# Patient Record
Sex: Female | Born: 1960 | Race: White | Hispanic: No | Marital: Married | Smoking: Current every day smoker
Health system: Southern US, Community
[De-identification: ages and names within clinical notes are randomized; demographics above are authoritative.]

## PROBLEM LIST (undated history)

## (undated) DIAGNOSIS — R112 Nausea with vomiting, unspecified: Secondary | ICD-10-CM

## (undated) DIAGNOSIS — Z9889 Other specified postprocedural states: Secondary | ICD-10-CM

## (undated) DIAGNOSIS — R630 Anorexia: Secondary | ICD-10-CM

## (undated) DIAGNOSIS — K859 Acute pancreatitis without necrosis or infection, unspecified: Secondary | ICD-10-CM

## (undated) DIAGNOSIS — F32A Depression, unspecified: Secondary | ICD-10-CM

## (undated) DIAGNOSIS — J45909 Unspecified asthma, uncomplicated: Secondary | ICD-10-CM

## (undated) DIAGNOSIS — F1010 Alcohol abuse, uncomplicated: Secondary | ICD-10-CM

## (undated) DIAGNOSIS — T4145XA Adverse effect of unspecified anesthetic, initial encounter: Secondary | ICD-10-CM

## (undated) DIAGNOSIS — F419 Anxiety disorder, unspecified: Secondary | ICD-10-CM

## (undated) DIAGNOSIS — T7840XA Allergy, unspecified, initial encounter: Secondary | ICD-10-CM

## (undated) DIAGNOSIS — F329 Major depressive disorder, single episode, unspecified: Secondary | ICD-10-CM

## (undated) DIAGNOSIS — M5430 Sciatica, unspecified side: Secondary | ICD-10-CM

## (undated) DIAGNOSIS — T8859XA Other complications of anesthesia, initial encounter: Secondary | ICD-10-CM

## (undated) DIAGNOSIS — M549 Dorsalgia, unspecified: Secondary | ICD-10-CM

## (undated) DIAGNOSIS — D649 Anemia, unspecified: Secondary | ICD-10-CM

## (undated) HISTORY — PX: BACK SURGERY: SHX140

## (undated) HISTORY — DX: Anxiety disorder, unspecified: F41.9

## (undated) HISTORY — DX: Allergy, unspecified, initial encounter: T78.40XA

## (undated) HISTORY — PX: OTHER SURGICAL HISTORY: SHX169

## (undated) HISTORY — DX: Anemia, unspecified: D64.9

## (undated) HISTORY — DX: Unspecified asthma, uncomplicated: J45.909

---

## 2002-11-14 ENCOUNTER — Other Ambulatory Visit: Admission: RE | Admit: 2002-11-14 | Discharge: 2002-11-14 | Payer: Self-pay | Admitting: Obstetrics and Gynecology

## 2004-01-03 ENCOUNTER — Other Ambulatory Visit: Admission: RE | Admit: 2004-01-03 | Discharge: 2004-01-03 | Payer: Self-pay | Admitting: Obstetrics and Gynecology

## 2005-02-11 ENCOUNTER — Other Ambulatory Visit: Admission: RE | Admit: 2005-02-11 | Discharge: 2005-02-11 | Payer: Self-pay | Admitting: Obstetrics and Gynecology

## 2007-12-25 ENCOUNTER — Emergency Department (HOSPITAL_COMMUNITY): Admission: EM | Admit: 2007-12-25 | Discharge: 2007-12-25 | Payer: Self-pay | Admitting: Family Medicine

## 2008-03-16 ENCOUNTER — Inpatient Hospital Stay (HOSPITAL_COMMUNITY): Admission: RE | Admit: 2008-03-16 | Discharge: 2008-03-21 | Payer: Self-pay | Admitting: Psychiatry

## 2008-03-16 ENCOUNTER — Ambulatory Visit: Payer: Self-pay | Admitting: Psychiatry
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
## 2011-03-07 NOTE — Discharge Summary (Signed)
NAMEYUETTE, PUTNAM        ACCOUNT NO.:  [id number]   MEDICAL RECORD NO.:  [id number]          PATIENT TYPE:  IPS   LOCATION:  0504                          FACILITY:  [facility]   PHYSICIAN:  Geoffery Lyons, M.D.      DATE OF BIRTH:  10-Jul-1961   DATE OF ADMISSION:  03/16/2008  DATE OF DISCHARGE:  03/21/2008                               DISCHARGE SUMMARY   CHIEF COMPLAINT AND PRESENT ILLNESS:  This was the first admission to  [name] for this 50 year old female.  She was brought  by her psychotherapist for inpatient treatment after she had attempted  suicide 2 weeks prior to this admission.  Apparently, she took a car and  went off on her own to a quiet place and took an overdose of the Ambien  and Zoloft and multiple other medications she could not remember hoping  to die in the car where no one would find her.  She woke up later sick,  took some more medications, and then fell miserable and managed to call  her husband who took her home where she suffered tremulousness with  nausea and vomiting.  She was not taken to the emergency room.  This was  2 weeks prior to this admission.  She has continued to feel very  depressed, feeling that she cannot go on living like this.  An  acquaintance of hers committed suicide 1 week prior to this admission.  She attended the funeral.  This increased her suicidal thoughts.  She  endorsed she has been dealing with depression for a long time since she  was a child.  First thoughts of suicide extend to attempts in high  school for which she received no treatment.  She had been drinking  alcohol since age 61.  Drinks 1 fifth of vodka daily along with several  glasses of wine.  She has a history of restricting her eating, trying to  limit her calories to 800 per day and routinely taking Extra Strength Ex-  Lax.  Over the course of the last year, she has been trying to cut back  on alcohol.  She had used some OxyContin, Percocet,  other opiates.  History of eating disorder.   PAST PSYCHIATRIC HISTORY:  She is being followed by Dr. Meredith Staggers and  Dr. Marliss Czar and has been seeing them for 15 months.  In the past,  she had been on Zoloft and Abilify.  She was putting weight, so she  transitioned to Wellbutrin.   ALCOHOL AND DRUG HISTORY:  As already stated, persistent use of alcohol.  She has one DWI charge.  Attended an intensive outpatient program at  [facility].   MEDICAL HISTORY:  Noncontributory.   MEDICATIONS:  Wellbutrin XL 300 mg per day.   PHYSICAL EXAMINATION:  Failed to show any acute findings.   LABORATORY WORK:  White blood cells 5.2, hemoglobin 14.9.  Sodium 139,  potassium 3.8, creatinine 0.8, glucose 83, SGOT 26, SGPT 15, total  bilirubin 0.1.   MENTAL STATUS EXAMINATION:  Reveals an alert cooperative female.  Mood  depressed.  Affect depressed, tearful at times.  Able to collect  herself, very appropriate, polite.  Speech is soft, barely audible at  times, normal in production.  Thought processes logical, coherent and  relevant.  Admitted to many years of heavy alcohol use.  There was  evidence of very poor self-esteem, suicidal thoughts, can contract for  safety.  No homicidal ideas, no delusions.  No hallucinations.  Cognition well-preserved.   ADMISSION DIAGNOSES:  AXIS I:  Alcohol dependence.  Major depression,  recurrent.  Eating disorder, not otherwise specified.  AXIS II:  No diagnosis.  AXIS III:  No diagnosis.  AXIS IV:  Moderate.  AXIS V:  On admission 35, highest GAF in the last year 75-80.   COURSE IN THE HOSPITAL:  She was admitted, started individual and group  psychotherapy.  We detoxified with Librium.  Resumed her Wellbutrin, and  she was also placed on Abilify.  She was able to open up, initially  quite resistant to address her issues.  There were some body image  distortions.  There was intellectualization rationalization.  June 1,  was having a hard time  coping. Endorsed she was under a lot of stress,  and it had to do with conflict in the relationship with the husband,  demands from work. Many of these demands were self-imposed.  Little  tolerance of being by herself.  Endorsed that she has used alcohol and  has been able to function with alcohol through the years.  No  significant sobriety.  We discussed options.  The need for going to  residential program was raised.  On June 2, she was fully detoxed.  She  was willing to go to the [facility] of [place], [place], to continue to  work on her recovery.   DISCHARGE DIAGNOSES:  AXIS I:  Alcohol dependence.  Major depressive  disorder.  Eating disorder, not otherwise specified.  AXIS II:  No diagnosis.  AXIS III:  No diagnosis.  AXIS IV:  Moderate.  AXIS V:  On discharge 50-55.   Discharged on:  1. Wellbutrin XL 300 mg in the morning.  2. Abilify 5 mg 1/2 daily in the morning.  3. Trazodone 50 mg at night.  4. Campral 333 mg two 3 times a day.   Follow up through [facility] [place], [place].      Geoffery Lyons, M.D.  Electronically Signed     IL/MEDQ  D:  04/18/2008  T:  04/18/2008  Job:  [id number]

## 2011-04-22 ENCOUNTER — Emergency Department (HOSPITAL_COMMUNITY)
Admission: EM | Admit: 2011-04-22 | Discharge: 2011-04-23 | Disposition: A | Discharge status: Other Acute Inpatient Hospital | Payer: BC Managed Care – PPO | Attending: Emergency Medicine | Admitting: Emergency Medicine

## 2011-04-22 DIAGNOSIS — F3289 Other specified depressive episodes: Secondary | ICD-10-CM | POA: Insufficient documentation

## 2011-04-22 DIAGNOSIS — R45851 Suicidal ideations: Secondary | ICD-10-CM | POA: Insufficient documentation

## 2011-04-22 DIAGNOSIS — F329 Major depressive disorder, single episode, unspecified: Secondary | ICD-10-CM | POA: Insufficient documentation

## 2011-04-22 LAB — CBC
MCH: 31.8 pg (ref 26.0–34.0)
MCHC: 33.1 g/dL (ref 30.0–36.0)
MCV: 96 fL (ref 78.0–100.0)
Platelets: 204 10*3/uL (ref 150–400)
RBC: 3.77 MIL/uL — ABNORMAL LOW (ref 3.87–5.11)

## 2011-04-22 LAB — DIFFERENTIAL
Basophils Relative: 0 % (ref 0–1)
Eosinophils Absolute: 0.1 10*3/uL (ref 0.0–0.7)
Eosinophils Relative: 1 % (ref 0–5)
Lymphs Abs: 0.8 10*3/uL (ref 0.7–4.0)
Monocytes Absolute: 0.5 10*3/uL (ref 0.1–1.0)
Monocytes Relative: 6 % (ref 3–12)
Neutrophils Relative %: 81 % — ABNORMAL HIGH (ref 43–77)

## 2011-04-22 LAB — COMPREHENSIVE METABOLIC PANEL
ALT: 13 U/L (ref 0–35)
AST: 17 U/L (ref 0–37)
Albumin: 3.8 g/dL (ref 3.5–5.2)
Alkaline Phosphatase: 55 U/L (ref 39–117)
CO2: 27 mEq/L (ref 19–32)
Chloride: 108 mEq/L (ref 96–112)
GFR calc non Af Amer: >60 mL/min (ref >60)
Potassium: 4.1 mEq/L (ref 3.5–5.1)
Sodium: 142 mEq/L (ref 135–145)
Total Bilirubin: 0.1 mg/dL — ABNORMAL LOW (ref 0.3–1.2)

## 2011-04-22 LAB — RAPID URINE DRUG SCREEN, HOSP PERFORMED
Barbiturates: NOT DETECTED
Tetrahydrocannabinol: NOT DETECTED

## 2011-04-23 ENCOUNTER — Inpatient Hospital Stay (HOSPITAL_COMMUNITY)
Admission: AD | Admit: 2011-04-23 | Discharge: 2011-04-25 | DRG: 430 | Disposition: A | Discharge status: Home / Self Care | Payer: BC Managed Care – PPO | Source: Ambulatory Visit | Attending: Psychiatry | Admitting: Psychiatry

## 2011-04-23 DIAGNOSIS — Z882 Allergy status to sulfonamides status: Secondary | ICD-10-CM

## 2011-04-23 DIAGNOSIS — R45851 Suicidal ideations: Secondary | ICD-10-CM

## 2011-04-23 DIAGNOSIS — F5000 Anorexia nervosa, unspecified: Secondary | ICD-10-CM

## 2011-04-23 DIAGNOSIS — F1020 Alcohol dependence, uncomplicated: Secondary | ICD-10-CM

## 2011-04-23 DIAGNOSIS — N809 Endometriosis, unspecified: Secondary | ICD-10-CM

## 2011-04-23 DIAGNOSIS — Z818 Family history of other mental and behavioral disorders: Secondary | ICD-10-CM

## 2011-04-23 DIAGNOSIS — F339 Major depressive disorder, recurrent, unspecified: Secondary | ICD-10-CM

## 2011-04-23 DIAGNOSIS — F17200 Nicotine dependence, unspecified, uncomplicated: Secondary | ICD-10-CM

## 2011-04-23 DIAGNOSIS — Z6379 Other stressful life events affecting family and household: Secondary | ICD-10-CM

## 2011-04-23 DIAGNOSIS — F332 Major depressive disorder, recurrent severe without psychotic features: Principal | ICD-10-CM
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
## 2011-04-28 NOTE — Discharge Summary (Signed)
[name], [name]        ACCOUNT NO.:  [id number]  MEDICAL RECORD NO.:  [id number]  LOCATION:  0506                          FACILITY:  [facility]  PHYSICIAN:  Franchot Gallo, MD     DATE OF BIRTH:  May 01, 1961  DATE OF ADMISSION:  04/23/2011 DATE OF DISCHARGE:  04/25/2011                              DISCHARGE SUMMARY   SUMMARY OF WHY ADMITTED:  The patient was admitted for evaluation of depressive symptoms with suicidal ideations as well as a history of anorexia nervosa with a recent weight loss down to 92 pounds.  The patient, at that time, stated that in the past several years her maximal weight has been approximately 108.  HOSPITAL COURSE:  As stated above, the patient was admitted for evaluation of depression and her eating disorder on April 23, 2011, and was seen by this physician on that date.  The patient was continued on Wellbutrin XL at 300 mg p.o. q.a.m. for depression, and the medication Prozac was started at 20 mg p.o. q.a.m. to further address her depressive symptoms and to possibly affect her appetite related to her eating disorder.  She was continued on the medication Abilify at 2 mg p.o. q.a.m. as well as her Topamax 50 mg p.o. b.i.d.,  started on a multivitamin and continued on trazodone 50 mg p.o. q.h.s. as needed for sleep.  The patient was next seen by this provider on April 24, 2011, and stated that she was sleeping good without difficulty.  She reported a good appetite and reported some mild feelings of sadness, anhedonia and depressed mood.  She rated her depression on a scale of 1 to 10 as a 3 and felt "more hopeful today."  She denied any suicidal or homicidal ideations or any auditory or visual hallucinations or delusional thinking.  She also denied any anxiety symptoms.  She denied any medication related side effects and stated that her weight today was up to 2-1/2 pounds to 94-1/2 pounds.  She was continued on her current medications.  The patient  was seen again by this provider on April 25, 2011, and stated that she was sleeping well without difficulty and reported a good appetite.  She stated that her depression was very mild, and on a scale of 1-10 was a 2.  She adamantly denied any suicidal or homicidal ideations.  She also denied any auditory or visual hallucinations or delusional thinking.  She stated her anxiety was nonexistent and on a scale of 1 to 10 was a zero, and denied any medication related side effects.  She stated that her weight today was 95 pounds.  The patient requested discharge and this was ordered per her request.  SIGNIFICANT LABORATORIES STUDIES:  None.  DISCHARGE MEDICATIONS: 1. Abilify 2 mg p.o. q.h.s. to address the patient's delusional     symptoms related to her body weight. 2. Wellbutrin XL 300 mg p.o. q.a.m. for depression. 3. Prozac 20 mg p.o. q.a.m. for depression and for treatment of her     eating disorder. 4. Multivitamin p.o. q.a.m. to improve her nutritional status. 5. Topamax 50 mg p.o. b.i.d. as a mood stabilizer and for anxiety. 6. Trazodone 50 mg p.o. q.h.s. p.r.n. for sleep.  DISCHARGE DIAGNOSES:  Axis I:  Major depressive disorder, recurrent, mild. Anorexia nervosa, currently under fair control. Alcohol dependence, recent relapse. Axis II:  Deferred. Axis III:  Laparoscopy in 1986 for treatment of endometriosis. Axis IV:  Recent marital separation and upcoming divorce.  Recent relapse in regards to her alcohol dependence.  Work related stress. Axis V:  GAF at time of admission approximately 35.  Highest GAF in the past year approximately 60.  GAF at time of discharge approximately 70.  CONDITION AT TIME OF DISCHARGE:  The patient was alert and oriented x3 and was friendly and cooperative with this provider.  Speech was appropriate in terms of rate and volume.  Mood appeared mildly depressed.  Affect was slightly constricted.  Thoughts:  The patient denied any obvious delusions  or hallucinations, nor does she report any suicidal or homicidal ideations.  Judgment and insight both appeared fair to good.  Anxiety was under good control and the patient denied any substance abuse related side effects or withdrawal symptoms.  FOLLOW-UP INSTRUCTIONS:  The patient is to follow up with her therapist, Marliss Czar, at [facility] tomorrow, Saturday, April 26, 2011 at 11:00 a.m.  She is also to follow-up with Dr. Jennelle Human at [facility] on May 20, 2011 at 11:00 a.m..  The patient also states that she is planning to attend an AA meeting tonight at discharge as well as meet with her sponsor.  She also plans to become involved in support groups for eating disorders.    __________________________________ Franchot Gallo, MD     RR/MEDQ  D:  04/25/2011  T:  04/26/2011  Job:  [id number]  Electronically Signed by Franchot Gallo MD on 04/28/2011 07:42:08 AM

## 2011-07-14 LAB — DIFFERENTIAL
Basophils Absolute: 0
Basophils Relative: 0
Eosinophils Relative: 0
Monocytes Absolute: 0.5
Neutro Abs: 10.9 — ABNORMAL HIGH

## 2011-07-14 LAB — CBC
Hemoglobin: 13.4
MCHC: 34.3
Platelets: 252
RDW: 12.9

## 2011-07-16 LAB — URINALYSIS, ROUTINE W REFLEX MICROSCOPIC
Bilirubin Urine: NEGATIVE
Ketones, ur: NEGATIVE
Nitrite: NEGATIVE
Protein, ur: NEGATIVE
Urobilinogen, UA: 0.2

## 2011-07-16 LAB — TSH: TSH: 5.183

## 2011-07-16 LAB — COMPREHENSIVE METABOLIC PANEL
ALT: 15
AST: 26
Calcium: 9.4
GFR calc Af Amer: >60
Glucose, Bld: 83
Sodium: 139
Total Protein: 7.5

## 2011-07-16 LAB — CBC
MCHC: 34
Platelets: 263
RDW: 13.4

## 2011-07-16 LAB — DRUGS OF ABUSE SCREEN W/O ALC, ROUTINE URINE
Amphetamine Screen, Ur: NEGATIVE
Cocaine Metabolites: NEGATIVE
Creatinine,U: 97.7
Phencyclidine (PCP): NEGATIVE
Propoxyphene: NEGATIVE

## 2011-07-16 LAB — PREGNANCY, URINE: Preg Test, Ur: NEGATIVE

## 2011-11-03 ENCOUNTER — Encounter (HOSPITAL_COMMUNITY): Payer: Self-pay | Admitting: *Deleted

## 2011-11-03 ENCOUNTER — Emergency Department (HOSPITAL_COMMUNITY)
Admission: EM | Admit: 2011-11-03 | Discharge: 2011-11-04 | Disposition: A | Discharge status: Other Acute Inpatient Hospital | Payer: BC Managed Care – PPO | Attending: Emergency Medicine | Admitting: Emergency Medicine

## 2011-11-03 DIAGNOSIS — F329 Major depressive disorder, single episode, unspecified: Secondary | ICD-10-CM | POA: Insufficient documentation

## 2011-11-03 DIAGNOSIS — F17200 Nicotine dependence, unspecified, uncomplicated: Secondary | ICD-10-CM | POA: Insufficient documentation

## 2011-11-03 DIAGNOSIS — F32A Depression, unspecified: Secondary | ICD-10-CM

## 2011-11-03 DIAGNOSIS — F1011 Alcohol abuse, in remission: Secondary | ICD-10-CM | POA: Insufficient documentation

## 2011-11-03 DIAGNOSIS — F3289 Other specified depressive episodes: Secondary | ICD-10-CM | POA: Insufficient documentation

## 2011-11-03 DIAGNOSIS — Z79899 Other long term (current) drug therapy: Secondary | ICD-10-CM | POA: Insufficient documentation

## 2011-11-03 DIAGNOSIS — F1010 Alcohol abuse, uncomplicated: Secondary | ICD-10-CM

## 2011-11-03 DIAGNOSIS — R45851 Suicidal ideations: Secondary | ICD-10-CM | POA: Insufficient documentation

## 2011-11-03 HISTORY — DX: Depression, unspecified: F32.A

## 2011-11-03 HISTORY — DX: Anorexia: R63.0

## 2011-11-03 HISTORY — DX: Alcohol abuse, uncomplicated: F10.10

## 2011-11-03 HISTORY — DX: Major depressive disorder, single episode, unspecified: F32.9

## 2011-11-03 LAB — CBC
HCT: 38.7 % (ref 36.0–46.0)
Hemoglobin: 12.8 g/dL (ref 12.0–15.0)
MCH: 32.2 pg (ref 26.0–34.0)
MCHC: 33.1 g/dL (ref 30.0–36.0)
MCV: 97.2 fL (ref 78.0–100.0)
Platelets: 216 10*3/uL (ref 150–400)
RBC: 3.98 MIL/uL (ref 3.87–5.11)
RDW: 13.7 % (ref 11.5–15.5)
WBC: 5.2 10*3/uL (ref 4.0–10.5)

## 2011-11-03 LAB — COMPREHENSIVE METABOLIC PANEL
ALT: 12 U/L (ref 0–35)
AST: 18 U/L (ref 0–37)
Albumin: 3.9 g/dL (ref 3.5–5.2)
Alkaline Phosphatase: 42 U/L (ref 39–117)
BUN: 14 mg/dL (ref 6–23)
CO2: 24 mEq/L (ref 19–32)
Calcium: 9.1 mg/dL (ref 8.4–10.5)
Chloride: 103 mEq/L (ref 96–112)
Creatinine, Ser: 0.92 mg/dL (ref 0.50–1.10)
GFR calc Af Amer: 83 mL/min — ABNORMAL LOW (ref >90)
GFR calc non Af Amer: 71 mL/min — ABNORMAL LOW (ref >90)
Glucose, Bld: 89 mg/dL (ref 70–99)
Potassium: 4 mEq/L (ref 3.5–5.1)
Sodium: 136 mEq/L (ref 135–145)
Total Bilirubin: 0.3 mg/dL (ref 0.3–1.2)
Total Protein: 7 g/dL (ref 6.0–8.3)

## 2011-11-03 LAB — DIFFERENTIAL
Basophils Absolute: 0 10*3/uL (ref 0.0–0.1)
Basophils Relative: 0 % (ref 0–1)
Eosinophils Absolute: 0.1 10*3/uL (ref 0.0–0.7)
Eosinophils Relative: 2 % (ref 0–5)
Lymphocytes Relative: 23 % (ref 12–46)
Lymphs Abs: 1.2 10*3/uL (ref 0.7–4.0)
Monocytes Absolute: 0.4 10*3/uL (ref 0.1–1.0)
Monocytes Relative: 7 % (ref 3–12)
Neutro Abs: 3.6 10*3/uL (ref 1.7–7.7)
Neutrophils Relative %: 68 % (ref 43–77)

## 2011-11-03 LAB — RAPID URINE DRUG SCREEN, HOSP PERFORMED
Amphetamines: NOT DETECTED
Barbiturates: NOT DETECTED
Benzodiazepines: POSITIVE — AB
Cocaine: NOT DETECTED
Opiates: NOT DETECTED
Tetrahydrocannabinol: NOT DETECTED

## 2011-11-03 LAB — ETHANOL: Alcohol, Ethyl (B): <11 mg/dL (ref 0–11)

## 2011-11-03 MED ORDER — ONDANSETRON HCL 4 MG PO TABS: 4.0000 mg | ORAL_TABLET | Freq: Three times a day (TID) | ORAL | Status: DC | PRN

## 2011-11-03 MED ORDER — TRAZODONE HCL 50 MG PO TABS: 50.0000 mg | ORAL_TABLET | Freq: Every day | ORAL | Status: DC

## 2011-11-03 MED ORDER — ARIPIPRAZOLE 5 MG PO TABS
5.0000 mg | ORAL_TABLET | Freq: Every day | ORAL | Status: DC
  Filled 2011-11-03: qty 1

## 2011-11-03 MED ORDER — ZOLPIDEM TARTRATE 5 MG PO TABS: 5.0000 mg | ORAL_TABLET | Freq: Every evening | ORAL | Status: DC | PRN

## 2011-11-03 MED ORDER — TRAZODONE HCL 100 MG PO TABS
100.0000 mg | ORAL_TABLET | Freq: Every day | ORAL | Status: DC
  Administered 2011-11-03: 100 mg via ORAL
  Filled 2011-11-03: qty 1

## 2011-11-03 MED ORDER — TOPIRAMATE 25 MG PO TABS: 25.0000 mg | ORAL_TABLET | Freq: Two times a day (BID) | ORAL | Status: DC

## 2011-11-03 MED ORDER — IBUPROFEN 200 MG PO TABS
600.0000 mg | ORAL_TABLET | Freq: Three times a day (TID) | ORAL | Status: DC | PRN
Start: 2011-11-03 — End: 2011-11-04

## 2011-11-03 MED ORDER — LORAZEPAM 1 MG PO TABS
1.0000 mg | ORAL_TABLET | Freq: Three times a day (TID) | ORAL | Status: DC | PRN
  Administered 2011-11-03: 1 mg via ORAL
  Filled 2011-11-03: qty 1

## 2011-11-03 MED ORDER — FLUOXETINE HCL 20 MG PO CAPS
20.0000 mg | ORAL_CAPSULE | Freq: Every day | ORAL | Status: DC
  Administered 2011-11-03: 20 mg via ORAL
  Filled 2011-11-03 (×2): qty 1

## 2011-11-03 MED ORDER — TRAZODONE HCL 100 MG PO TABS: 100.0000 mg | ORAL_TABLET | Freq: Every day | ORAL | Status: DC

## 2011-11-03 MED ORDER — TOPIRAMATE 25 MG PO TABS: 50.0000 mg | ORAL_TABLET | Freq: Two times a day (BID) | ORAL | Status: DC

## 2011-11-03 MED ORDER — ARIPIPRAZOLE 10 MG PO TABS
10.0000 mg | ORAL_TABLET | Freq: Every day | ORAL | Status: DC
  Filled 2011-11-03 (×2): qty 1

## 2011-11-03 MED ORDER — ACETAMINOPHEN 325 MG PO TABS: 650.0000 mg | ORAL_TABLET | ORAL | Status: DC | PRN

## 2011-11-03 MED ORDER — BUPROPION HCL ER (XL) 300 MG PO TB24
300.0000 mg | ORAL_TABLET | Freq: Every day | ORAL | Status: DC
  Filled 2011-11-03: qty 1

## 2011-11-03 MED ORDER — BUPROPION HCL ER (XL) 300 MG PO TB24
300.0000 mg | ORAL_TABLET | Freq: Every day | ORAL | Status: DC
  Filled 2011-11-03 (×2): qty 1

## 2011-11-03 MED ORDER — TOPIRAMATE 25 MG PO TABS
25.0000 mg | ORAL_TABLET | Freq: Every day | ORAL | Status: DC
  Administered 2011-11-03: 25 mg via ORAL
  Filled 2011-11-03: qty 1

## 2011-11-03 NOTE — ED Notes (Signed)
3 Pt belonging bags placed in Equipment room of [facility]

## 2011-11-03 NOTE — ED Notes (Signed)
Pt states "have been trying to do outpt, taking the librium since Wed, but it's not working, I want to kill myself, I have several plans, I have tried to do this twice before, the last time I just took 27 sleeping pills and so I accumulated, I think, about 40.  I also, I have access to the roof of my building and that's about 5 stories high, and I thought about just loading my car with a bunch of booze and talking all the pills I could get my hands on and leaving & going where no on could find me, I also thought about going out [place] a [place] and slamming my car head on into a tree"

## 2011-11-03 NOTE — BH Assessment (Signed)
Assessment Note   [name] [name] is an 51 y.o. female. Pt presented to the [facility] with a chief complaint of depression, SI with plan and alcohol abuse. Pt states that she has been really depressed for 2 weeks, before she had an "pretty bad episode", drinking large amounts of Vodka. Pt states that she has agreed with her therapist to complete an outpatient detox and was taking librium, though the depression worsened. Pt states that she has never felt this deep of a depression before. "I have been unable to get out of bed" and "I just think about ending it all." Pt states she had been sober since 06/30/11 though began drinking heavily after "things at work were going really poorly and crashing down." Pt states at this point she is unsure if she is still employed. Pt reports her last use was 10/29/11 and had  2 drinks equalling a pint of vodka. Pt has a hx of 2 previous SI attempts with the last attempt 2 years ago. Pt states that she had taken 27 [place] and [place] her home. Pt states she was not hospitalized at that time because her then husband "didn't want anyone to know". Pt's mood is depressed with a flat affect. Pt states her current SI plan is to "take all her pills or drive away where no one can find her, take her pills and then drink booze."  Pt information has been sent to [facility] and [facility] for review.   Axis I: Major Depression, Recurrent severe Axis II: Deferred Axis III:  Past Medical History  Diagnosis Date  . Anorexia   . Alcohol abuse   . Depression    Axis IV: other psychosocial or environmental problems and problems with primary support group Axis V: 31-40 impairment in reality testing  Past Medical History:  Past Medical History  Diagnosis Date  . Anorexia   . Alcohol abuse   . Depression     Past Surgical History  Procedure Date  . Endometrial laproscopic     Family History: No family history on  file.  Social History:  reports that she has been smoking.  She does not have any smokeless tobacco history on file. She reports that she drinks alcohol. She reports that she does not use illicit drugs.  Additional Social History:    Allergies:  Allergies  Allergen Reactions  . Sulfa Antibiotics Hives    Hives & edema    Home Medications:  Medications Prior to Admission  Medication Dose Route Frequency Provider Last Rate Last Dose  . acetaminophen (TYLENOL) tablet 650 mg  650 mg Oral Q4H PRN Raeford Razor, MD      . ARIPiprazole (ABILIFY) tablet 10 mg  10 mg Oral Daily Raeford Razor, MD      . buPROPion (WELLBUTRIN XL) 24 hr tablet 300 mg  300 mg Oral Daily Raeford Razor, MD      . FLUoxetine (PROZAC) capsule 20 mg  20 mg Oral Daily Raeford Razor, MD      . ibuprofen (ADVIL,MOTRIN) tablet 600 mg  600 mg Oral Q8H PRN Raeford Razor, MD      . LORazepam (ATIVAN) tablet 1 mg  1 mg Oral Q8H PRN Raeford Razor, MD      . ondansetron [facility]) tablet 4 mg  4 mg Oral Q8H PRN Raeford Razor, MD      . topiramate (TOPAMAX) tablet 50 mg  50 mg Oral BID Jeannett Senior  Kohut, MD      . traZODone (DESYREL) tablet 50 mg  50 mg Oral QHS Raeford Razor, MD      . zolpidem [facility]) tablet 5 mg  5 mg Oral QHS PRN Raeford Razor, MD       No current outpatient prescriptions on file as of 11/03/2011.    OB/GYN Status:  Patient's last menstrual period was 10/20/2011.  General Assessment Data Location of Assessment: [facility] ED Living Arrangements: Children (49 y/o daughter) Can pt return to current living arrangement?: Yes Admission Status: Voluntary Is patient capable of signing voluntary admission?: Yes Transfer from: Acute Hospital Referral Source: Self/Family/Friend  Education Status Is patient currently in school?: No  Risk to self Suicidal Ideation: Yes-Currently Present Suicidal Intent: Yes-Currently Present Is patient at risk for suicide?: Yes Suicidal Plan?: Yes-Currently Present Specify Current  Suicidal Plan: "I counted out all my pills to take them" "I also thought about taking all my pills and booze and driving away where no one can find me" Access to Means: Yes Specify Access to Suicidal Means: has medications and car What has been your use of drugs/alcohol within the last 12 months?: ETOH; 1/5 and 1/2 vodka daily past few weeks Previous Attempts/Gestures: Yes How many times?: 2  Other Self Harm Risks: none reported Triggers for Past Attempts: Other (Comment) (Depression) Intentional Self Injurious Behavior: None Family Suicide History: Yes (Maternal grandmother- alcohol, suicide) Recent stressful life event(s): Other (Comment) (issues at work) Persecutory voices/beliefs?: No Depression: Yes Depression Symptoms: Despondent;Insomnia;Tearfulness;Isolating;Fatigue;Loss of interest in usual pleasures;Feeling worthless/self pity Substance abuse history and/or treatment for substance abuse?: Yes Suicide prevention information given to non-admitted patients: Not applicable  Risk to Others Homicidal Ideation: No Thoughts of Harm to Others: No Current Homicidal Intent: No Current Homicidal Plan: No Access to Homicidal Means: No Identified Victim: none History of harm to others?: No Assessment of Violence: None Noted Violent Behavior Description: pt was calm and cooperative throughout assessment Does patient have access to weapons?: No Criminal Charges Pending?: No Does patient have a court date: No  Psychosis Hallucinations: None noted Delusions: None noted  Mental Status Report Appear/Hygiene: Disheveled Eye Contact: Fair Motor Activity: Unremarkable Speech: Soft;Slow;Logical/coherent Level of Consciousness: Quiet/awake Mood: Depressed;Sad;Empty Affect: Blunted;Depressed;Sad Anxiety Level: None Thought Processes: Coherent;Relevant Judgement: Impaired Orientation: Person;Place;Time;Situation Obsessive Compulsive Thoughts/Behaviors: None  Cognitive  Functioning Concentration: Decreased Memory: Recent Intact;Remote Intact IQ: Average Insight: Poor Impulse Control: Poor Appetite: Poor Weight Loss: 0  Weight Gain: 0  Sleep: No Change Total Hours of Sleep: 8  Vegetative Symptoms: Staying in bed  Prior Inpatient Therapy Prior Inpatient Therapy: Yes Prior Therapy Dates: 2008 Prior Therapy Facilty/Provider(s): [facility], [facility] Reason for Treatment: rehab  Prior Outpatient Therapy Prior Outpatient Therapy: Yes Prior Therapy Dates: 2008-present Prior Therapy Facilty/Provider(s): Dr. Jennelle Human, Dr. Farrel Demark Reason for Treatment: depression            Values / Beliefs Cultural Requests During Hospitalization: None Spiritual Requests During Hospitalization: None        Additional Information 1:1 In Past 12 Months?: No CIRT Risk: No Elopement Risk: No Does patient have medical clearance?: Yes     Disposition:  Disposition Disposition of Patient: Referred to [facility], OV) Patient referred to: Other (Comment) ([facility], [facility])  On Site Evaluation by:   Reviewed with Physician:     [place] Crane F 11/03/2011 10:03 PM

## 2011-11-04 ENCOUNTER — Inpatient Hospital Stay (HOSPITAL_COMMUNITY)
Admission: EM | Admit: 2011-11-04 | Discharge: 2011-11-10 | DRG: 750 | Disposition: A | Discharge status: Home / Self Care | Payer: BC Managed Care – PPO | Source: Ambulatory Visit | Attending: Psychiatry | Admitting: Psychiatry

## 2011-11-04 ENCOUNTER — Encounter (HOSPITAL_COMMUNITY): Payer: Self-pay | Admitting: *Deleted

## 2011-11-04 DIAGNOSIS — F1020 Alcohol dependence, uncomplicated: Principal | ICD-10-CM | POA: Diagnosis present

## 2011-11-04 DIAGNOSIS — F329 Major depressive disorder, single episode, unspecified: Secondary | ICD-10-CM

## 2011-11-04 DIAGNOSIS — Z79899 Other long term (current) drug therapy: Secondary | ICD-10-CM

## 2011-11-04 DIAGNOSIS — F332 Major depressive disorder, recurrent severe without psychotic features: Secondary | ICD-10-CM

## 2011-11-04 DIAGNOSIS — IMO0002 Reserved for concepts with insufficient information to code with codable children: Secondary | ICD-10-CM

## 2011-11-04 DIAGNOSIS — Z681 Body mass index (BMI) 19 or less, adult: Secondary | ICD-10-CM

## 2011-11-04 DIAGNOSIS — F39 Unspecified mood [affective] disorder: Secondary | ICD-10-CM

## 2011-11-04 DIAGNOSIS — F5000 Anorexia nervosa, unspecified: Secondary | ICD-10-CM

## 2011-11-04 DIAGNOSIS — R45851 Suicidal ideations: Secondary | ICD-10-CM

## 2011-11-04 DIAGNOSIS — Z882 Allergy status to sulfonamides status: Secondary | ICD-10-CM

## 2011-11-04 MED ORDER — TRAZODONE HCL 50 MG PO TABS
50.0000 mg | ORAL_TABLET | Freq: Every day | ORAL | Status: DC
  Administered 2011-11-04 – 2011-11-09 (×6): 50 mg via ORAL
  Filled 2011-11-04 (×8): qty 1

## 2011-11-04 MED ORDER — CHLORDIAZEPOXIDE HCL 25 MG PO CAPS
25.0000 mg | ORAL_CAPSULE | Freq: Three times a day (TID) | ORAL | Status: DC | PRN
  Administered 2011-11-04 – 2011-11-09 (×3): 25 mg via ORAL
  Filled 2011-11-04 (×3): qty 1

## 2011-11-04 MED ORDER — ALUM & MAG HYDROXIDE-SIMETH 200-200-20 MG/5ML PO SUSP: 30.0000 mL | ORAL | Status: DC | PRN

## 2011-11-04 MED ORDER — MAGNESIUM HYDROXIDE 400 MG/5ML PO SUSP
30.0000 mL | Freq: Every day | ORAL | Status: DC | PRN
  Administered 2011-11-07 – 2011-11-09 (×2): 30 mL via ORAL

## 2011-11-04 MED ORDER — ACETAMINOPHEN 325 MG PO TABS
650.0000 mg | ORAL_TABLET | Freq: Four times a day (QID) | ORAL | Status: DC | PRN
  Administered 2011-11-05 – 2011-11-10 (×10): 650 mg via ORAL

## 2011-11-04 MED ORDER — FLUOXETINE HCL 20 MG PO CAPS
20.0000 mg | ORAL_CAPSULE | Freq: Every day | ORAL | Status: DC
  Administered 2011-11-04 – 2011-11-06 (×3): 20 mg via ORAL
  Filled 2011-11-04 (×5): qty 1

## 2011-11-04 MED ORDER — ARIPIPRAZOLE 10 MG PO TABS
10.0000 mg | ORAL_TABLET | Freq: Every day | ORAL | Status: DC
  Administered 2011-11-04 – 2011-11-10 (×7): 10 mg via ORAL
  Filled 2011-11-04 (×9): qty 1

## 2011-11-04 MED ORDER — INFLUENZA VIRUS VACC SPLIT PF IM SUSP
0.5000 mL | INTRAMUSCULAR | Status: AC
  Administered 2011-11-05: 0.5 mL via INTRAMUSCULAR

## 2011-11-04 MED ORDER — TRAZODONE HCL 100 MG PO TABS: 100.0000 mg | ORAL_TABLET | Freq: Every evening | ORAL | Status: DC | PRN

## 2011-11-04 MED ORDER — BUPROPION HCL ER (XL) 300 MG PO TB24
300.0000 mg | ORAL_TABLET | Freq: Every day | ORAL | Status: DC
  Administered 2011-11-04 – 2011-11-10 (×7): 300 mg via ORAL
  Filled 2011-11-04 (×10): qty 1

## 2011-11-04 NOTE — Discharge Planning (Addendum)
Encounter with patient in hall just now.  Asking for meds.  Gave me a list of scheduled meds that include librium.  Asked when her last drink was and she said last Wed.  UDS positive for benzos.  Review of chart indicates she was being prescribed benzos on an out-pt basis.  Will call her provider for further info.   Kalysta signed release of info for [employer], her provider for mental health services since 08.  Sees Dr Jennelle Human and Marliss Czar.  Was on an outpt librium detox protocol prior to coming in.  Started on 1/10 for 2 days at the rate of 25 QID, 1/12 and 13 at TID,  Yesterday and today at BID and 1/16 and 17.  Detox protocol was in response to relapse in which Tzivia was drinking a 5th a gallon of vodka daily.  Also has been prescribed Camprol.  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  Therapist describes her as compulsively taking care of others.  Also anorexic.

## 2011-11-04 NOTE — Progress Notes (Signed)
Patient ID: [name] [name], female   DOB: 1961-09-30, 51 y.o.   MRN: [id number]  Admitted after SI with plan to either OD on sleeping pills or pack up car and load up on ETOH and drive to remote location to kill self. Patient reports having two previous suicide attempts. Last attempt is by OD of 27 sleeping pills. Patient then stated that her current ideation will have her ODing on more sleeping pills than before, as she knows how much her body could handle. States 'I feel like I don't want to live anymore.' Patient states that she is able to contract while on the unit but had a long pause before answering. Reports substance abuse with alcohol. Last drink was Wednesday 10/29/11, had nearly 1 pint. Started librium on 10/29/11. Has been drinking since age 31 but got worse over past 6 years r/t marriage falling apart (patient is now divorced) and other stressors that patient would not elaborate on. Currently lives with daughter, age 38. Also has son, 107, who is in college. States that children are the reason she did not go through with suicide this past week. Patient's BP on admission was 86/54 sitting and 84/60 standing. Patient reports that this is her baseline BP and this was the trend as patient was in the ED. Patient given fluids. Patient instructed about rising slowly from laying/seated positions to standing. Patient made fall risk for low BP and history of falls within past 6 months.  Skin assessment: blood draw to right [facility]  Emergency Contact: Trey Bebee (ex.husband) [phone]

## 2011-11-04 NOTE — ED Notes (Signed)
Patient is resting comfortably. 

## 2011-11-04 NOTE — ED Notes (Signed)
Report given. Tech and security with pt to transport to behavioral health. Pt is ambulatory with minimal assistance.

## 2011-11-04 NOTE — Tx Team (Signed)
Initial Interdisciplinary Treatment Plan  PATIENT STRENGTHS: (choose at least two) Average or above average intelligence Communication skills Financial means Physical Health Supportive family/friends  PATIENT STRESSORS: Marital or family conflict Substance abuse   PROBLEM LIST: Problem List/Patient Goals Date to be addressed Date deferred Reason deferred Estimated date of resolution  Risk for suicide 11/04/11     depression 11/04/11     Substance abuse- ETOH 11/04/11                                          DISCHARGE CRITERIA:  Ability to meet basic life and health needs Adequate post-discharge living arrangements Improved stabilization in mood, thinking, and/or behavior Motivation to continue treatment in a less acute level of care Withdrawal symptoms are absent or subacute and managed without 24-hour nursing intervention  PRELIMINARY DISCHARGE PLAN: Attend aftercare/continuing care group Attend PHP/IOP Attend 12-step recovery group Participate in family therapy Return to previous living arrangement  PATIENT/FAMIILY INVOLVEMENT: This treatment plan has been presented to and reviewed with the patient, [name] [name].  The patient and family have been given the opportunity to ask questions and make suggestions.  [name] [name] 11/04/2011, 5:46 AM

## 2011-11-04 NOTE — Progress Notes (Signed)
1-1 Note @ 1800 d/t suicidal ideation. Pt. Is currently on 1-1 resting in bed with eyes closed. 1-1 continues as pt. Is unable to contract for safety.Pt. Safety maintained.

## 2011-11-04 NOTE — Progress Notes (Signed)
Patient ID: [name] [name], female   DOB: 04/26/1961, 51 y.o.   MRN: [id number] Pt reports fair sleep and poor appetite.  Her energy level is low and her ability to pay attention is poor.  Pt has sciatica and wrote on self inventory that she thinks her pain is punishment for her  Wrongs.  She is attending group and looking forward to restarting meds.

## 2011-11-04 NOTE — Progress Notes (Signed)
2200 note for 1-1. Pt. Has a very flat ,blunted affect. Pt. Is not able to contract for safety. Staff with pt. @ all times. 1-1 Continues for increased pt safety. Pt. Safety maintained.

## 2011-11-04 NOTE — Progress Notes (Signed)
[facility] Group Notes:  (Counselor/Nursing/MHT/Case Management/Adjunct)  11/04/2011 4:04 PM   Type of Therapy:  Processing Group at 11:00 am  Participation Level:  Minimal  Participation Quality:  Attentive and quiet  Affect:  Depressed  Cognitive:  Oriented  Insight:  None shared  Engagement in Group:  Limited  Engagement in Therapy:  Limited  Modes of Intervention:  Exploration and clarification and support  Summary of Progress/Problems:  [name] [name] came in at that point the group processing session and was invited to share what brought her into the hospital with other members of the group.  Patient was slow to respond but did share that alcohol played a role in her admission. Patient was attentive throughout group.  [facility] Group Notes:  (Counselor/Nursing/MHT/Case Management/Adjunct)  11/04/2011 4:04 PM   Type of Therapy:  Counseling Group at 1:15 pm  Participation Level:  Limited  Participation Quality:  Attentive  Affect:  Depressed  Cognitive:  Oriented  Insight:  Limited  Engagement in Group:  Minimal  Engagement in Therapy:  Minimal  Modes of Intervention:  Exploration, education and support  Summary of Progress/Problems:  [name] [name] again came into group at midpoint' was attempted to educational components of group especially regarding PAWS and segment on anger.  [name] [name] was attentive to other group members yet hesitant to share. Patient did share that frustration is one of the hardest things for her to deal with; "Rarely do people see me angry" [name] [name], LCSWA 11/04/2011 4:04 PM

## 2011-11-04 NOTE — Progress Notes (Signed)
Adult Psychosocial Assessment Update Interdisciplinary Team  Previous [facility] admissions/discharges:  Admissions Discharges  Date: April 23, 2011 Date:  Date: Date:  Date: Date:  Date: Date:  Date: Date:   Changes since the last Psychosocial Assessment (including adherence to outpatient mental health and/or substance abuse treatment, situational issues contributing to decompensation and/or relapse). Pt was experiencing increased depression while completing outpatient detox with Librium; she and therapist decided she should come in for inpatient detox. Patient reports being sober from 06/30/2011 until New Year's E. when she relapsed on alcohol due to stressors about uncertainty of employment not knowing if her job is secure .patient reports using one to 2/5 of alcohol daily for 10 days. 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 Divorce will be finalized in the next few weeks patient shares custody of 54 year old daughter with her husband from whom she is separated, also has a son in college. Patient self-reports suicide attempt in 2010 previous diagnosis of anorexia alcohol abuse and depression.              Discharge Plan 1. Will you be returning to the same living situation after discharge?   Yes:X No:      If no, what is your plan?           2. Would you like a referral for services when you are discharged? Yes:     If yes, for what services?  No:   X    Patient sees a therapist by the name od Billey Gosling        Summary and Recommendations (to be completed by the evaluator) Patient is a 51 year old separated female admitted with diagnosis of major depression, recurrent. Patient's divorce is to be finalized this month; relapsed on alcohol (1-2 as per day) for 10 days reportedly 2 to job stress source as she is uncertain of her employment. Patient reports  anxiety re weight, currently at 109.5 as she considers 100 Pounds ideal. Patient will benefit from crisis stabilization, medication Evaluation, Group therapy and psychoeducation in addition to Case Management for discharge planning.                        Signature:  Clide Dales, 11/04/2011 3:54 PM

## 2011-11-04 NOTE — ED Provider Notes (Signed)
History    50yf with SI. Chronic etoh abuse and just stopped this past Wednesday. tx'd as outpt with librium. Since stopped has been increasingly depressed. Thoughts of OD or running car into tree. Says has been stock piling sleeping meds. Previous suicide attempt by od. Denies illicit drug use. No hi. No hallucinations. Previous psych admission for similar.  CSN: [id number]  Arrival date & time 11/03/11  1827   First MD Initiated Contact with Patient 11/03/11 2001      Chief Complaint  Patient presents with  . V70.1    (Consider location/radiation/quality/duration/timing/severity/associated sxs/prior treatment) HPI  Past Medical History  Diagnosis Date  . Anorexia   . Alcohol abuse   . Depression     Past Surgical History  Procedure Date  . Endometrial laproscopic     No family history on file.  History  Substance Use Topics  . Smoking status: Current Everyday Smoker -- 0.5 packs/day  . Smokeless tobacco: Not on file  . Alcohol Use: Yes     1.5 fifth QD    OB History    Grav Para Term Preterm Abortions TAB SAB Ect Mult Living                  Review of Systems   Review of symptoms negative unless otherwise noted in HPI.   Allergies  Sulfa antibiotics  Home Medications   Current Outpatient Rx  Name Route Sig Dispense Refill  . ARIPIPRAZOLE 5 MG PO TABS Oral Take 5 mg by mouth daily.    . BUPROPION HCL ER (XL) 150 MG PO TB24 Oral Take 300 mg by mouth every morning.    [name] CHLORDIAZEPOXIDE HCL 25 MG PO CAPS Oral Take 25 mg by mouth 4 (four) times daily as needed. sedated    . FLUOXETINE HCL 20 MG PO CAPS Oral Take 20 mg by mouth daily.    . TOPIRAMATE 25 MG PO TABS Oral Take 25 mg by mouth 2 (two) times daily.    . TRAZODONE HCL 100 MG PO TABS Oral Take 100 mg by mouth at bedtime.      BP 93/51  Pulse 57  Temp(Src) 98.6 F (37 C) (Oral)  Resp 18  Ht 5\' 6"  (1.676 m)  Wt 115 lb (52.164 kg)  BMI 18.56 kg/m2  SpO2 100%  LMP 10/20/2011  Physical  Exam  Nursing note and vitals reviewed. Constitutional: She appears well-developed. No distress.       Thin and frail appearing  HENT:  Head: Normocephalic and atraumatic.  Eyes: Conjunctivae are normal. Pupils are equal, round, and reactive to light. Right eye exhibits no discharge. Left eye exhibits no discharge.  Neck: Normal range of motion. Neck supple.  Cardiovascular: Normal rate, regular rhythm and normal heart sounds.  Exam reveals no gallop and no friction rub.   No murmur heard. Pulmonary/Chest: Effort normal and breath sounds normal. No respiratory distress.  Abdominal: Soft. She exhibits no distension. There is no tenderness.  Musculoskeletal: She exhibits no edema and no tenderness.  Lymphadenopathy:    She has no cervical adenopathy.  Neurological: She is alert.  Skin: Skin is warm and dry.  Psychiatric: Thought content normal.       flat affect. Speech clear and content appropriate.     ED Course  Procedures (including critical care time)  Labs Reviewed  URINE RAPID DRUG SCREEN (HOSP PERFORMED) - Abnormal; Notable for the following:    Benzodiazepines POSITIVE (*)    All other components  within normal limits  COMPREHENSIVE METABOLIC PANEL - Abnormal; Notable for the following:    GFR calc non Af Amer 71 (*)    GFR calc Af Amer 83 (*)    All other components within normal limits  CBC  DIFFERENTIAL  ETHANOL  POCT PREGNANCY, URINE  POCT PREGNANCY, URINE   No results found.   1. Depression   2. Suicidal ideation   3. ETOH abuse       MDM  50yF with etoh abuse, SI and depression. Plans to OD. Discussed with ACT who will eval. Pt in agreement for voluntary admission at this time.        Raeford Razor, MD 11/04/11 [phone]
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
## 2011-11-05 NOTE — Progress Notes (Signed)
Patient ID: [name] [name], female   DOB: [birth year], 51 y.o.   MRN: [id number] Nursing     Pt.rates her Depression and Hopelessness as 10/10,She has been medicated for sciatic pain with some relief.states she is having suicidal thoughts off and on, Remains on 1-1 for safety.Will follow.

## 2011-11-05 NOTE — Treatment Plan (Signed)
Interdisciplinary Treatment Plan Update (Adult)  Date: 11/05/2011  Time Reviewed: 9:37 AM   Progress in Treatment: Attending groups: Yes Participating in groups: Yes Taking medication as prescribed: Yes Tolerating medication: Yes   Family/Significant other contact made:  Bobby gave permission to talk to her therapist Patient understands diagnosis:  Yes  As evidenced by asking for help with alcoholism, depression Discussing patient identified problems/goals with staff:  Yes See below Medical problems stabilized or resolved:  Yes Denies suicidal/homicidal ideation: No  Per self inventory and in treatment team Issues/concerns per patient self-inventory:  Yes  Depression and hopelessness a 10   Sciatica pain is rated an 8 Other:  New problem(s) identified: N/A  Reason for Continuation of Hospitalization: Medication stabilization Withdrawal symptoms  Interventions implemented related to continuation of hospitalization: Librium taper  Encourage group attendance and participation  Address pain  Additional comments:On 1:1 today due to thoughts of self harm and extreme hopelessness  Estimated length of stay: 3-4 days  Discharge Plan: Return home  Follow up outpt  New goal(s): N/A  Review of initial/current patient goals per problem list:   1.  Goal(s):Safely detox from alcohol  Met:  No  Target date:1/18  As evidenced [id number] in CIWA score from current to 0  2.  Goal (s): Identify comprehensive sobriety plan  Met:  No  Target date:1/18  As evidenced UJ:WJXB disclosure  3.  Goal(s):Decrease depression  Met:  No  Target date:1/18  As evidenced JY:[id number] to 3 or less on self inventory  4.  Goal(s):  Met:  No  Target date:  As evidenced by:  Attendees: Patient:  [name] [name] 11/05/2011 9:37 AM  Family:     Physician:  Lupe Carney 11/05/2011 9:37 AM   Nursing:  Milinda Cave  11/05/2011 9:37 AM   Case Manager:  Richelle Ito, LCSW  11/05/2011 9:37 AM   Counselor:  Ronda Fairly, LCSWA 11/05/2011 9:37 AM   Other:     Other:     Other:     Other:      Scribe for Treatment Team:   Ida Rogue, 11/05/2011 9:37 AM

## 2011-11-05 NOTE — Progress Notes (Signed)
Pt attended morning d/c planning group and treatment team meeting. Stated that her psychiatrist felt that it was a good idea for her to check in at the hospital because she was concerned that Pt would harm herself. When asked if she still had feelings of harming herself, Pt response was "it's a good thing that I am here." Stated in treatment team that she has working 60-70 hours for a [employer] for several years without receiving any financial reimbursement. Feels guilty about the thought of not volunteering there anymore because she feels that it is what she is called to do. Also stated that the finalization of her divorce will be a relief for her. Complains of dizziness and pain due to a sciatic nerve in her back. Presents with depressed mood, and flat affect. Admits to alcohol dependence issues.  "I am caught up in this vicious cycle with alcohol.  I had 18 months clean before I relapsed."

## 2011-11-05 NOTE — Progress Notes (Signed)
[facility] Group Notes:  (Counselor/Nursing/MHT/Case Management/Adjunct)  11/05/2011 1:08 PM   Type of Therapy:  Processing Group at 11:00 am  Participation Level:  Minimal  Participation Quality:  Drowsy  Affect:  Depressed  Cognitive:  Oriented  Insight:  Limited  Engagement in Group:  Limited  Engagement in Therapy:  Limited  Modes of Intervention:  Exploration and clarification  Summary of Progress/Problems:  Favor's shared when asked about mindfulness that she spends most of her time in her thinking brain versus emotional brain. 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 Patient was very drowsy, cold and wrapped in blanket, and although hesitant to share seemed more open than first day.[name]  [facility] Group Notes:  (Counselor/Nursing/MHT/Case Management/Adjunct)  11/05/2011 3:57 PM   Type of Therapy:  Counseling Group at 1:15 pm  Participation Level:  Did Not Attend   Ronda Fairly, LCSWA 11/05/2011 3:57 PM

## 2011-11-05 NOTE — Progress Notes (Signed)
[facility] MD Progress Note  11/05/2011 10:53 AM  S/O: Pt seen and evaluated in treatment team.  Reviewed short term and long term goals, medications, current treatment in the hospital and acute/chronic safety.  Pt with sig depressive s/s, and still in need of 1:1.  Nevertheless, acknowledged kids as main protective factor.  Pt agreeable with treatment plan, see TP note and orders. Continue current medications.   Sleep:  Number of Hours: 6.5   Vital Signs:Blood pressure 81/49, pulse 76, temperature 97.9 F (36.6 C), temperature source Oral, resp. rate 16, height 5' 4.5" (1.638 m), weight 48.535 kg (107 lb), last menstrual period 10/20/2011.  Lab Results:  Results for orders placed during the hospital encounter of 11/04/11 (from the past 48 hour(s))  TSH     Status: Normal   Collection Time   11/04/11  7:55 PM      Component Value Range Comment   TSH 2.868  0.350 - 4.500 (uIU/mL)   T4, FREE     Status: Normal   Collection Time   11/04/11  7:55 PM      Component Value Range Comment   Free T4 0.89  0.80 - 1.80 (ng/dL)     Meds:   . ARIPiprazole  10 mg Oral Daily  . buPROPion  300 mg Oral Daily  . FLUoxetine  20 mg Oral Daily  . influenza  inactive virus vaccine  0.5 mL Intramuscular Tomorrow-1000  . traZODone  50 mg Oral QHS    A/P: Alcohol Dependence, possible continued w/d s/s; Mood Disorder NOS; Anorexia Nervosa; r/o BPD & PTSD  Significant depressive s/s noted.  Hx reported "mood swings".  Will continue current meds.  Medication education completed.  Pros, cons, risks, potential side effects and benefits were discussed with pt.  Pt agreeable with the plan.  See orders.  Discussed with team.   Will also continue 1:1 and reassess in am.  Lupe Carney 11/05/2011, 10:53 AM

## 2011-11-06 DIAGNOSIS — F339 Major depressive disorder, recurrent, unspecified: Secondary | ICD-10-CM

## 2011-11-06 MED ORDER — FLUOXETINE HCL 20 MG PO CAPS
40.0000 mg | ORAL_CAPSULE | Freq: Every day | ORAL | Status: DC
  Administered 2011-11-07 – 2011-11-10 (×4): 40 mg via ORAL
  Filled 2011-11-06 (×5): qty 2

## 2011-11-06 NOTE — Progress Notes (Signed)
Patient ID: [name] [name], female   DOB: [birth year], 51 y.o.   MRN: [id number]  Stated she would have had a better day if it had not been for pain. Writer attempted to converse with the pt, however she said her "head was too messed up". Pt denied SI during the assessment, but stated she had it earlier in the day. Support and encouragement was offered.

## 2011-11-06 NOTE — Progress Notes (Signed)
Patient ID: [name] [name], female   DOB: 1961/09/03, 51 y.o.   MRN: [id number] [name] [name]  51 y.o.  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 Reports she is still depressed and rates it as a 8/10. She states she no longer feels suicidal but states that "this is the worst it's ever been." She acknowledges that drinking alcohol didn't help the situation at all. She voices concern that her Wellbutrin just isn't working since she has been on it for the last 2-3 years and has gotten to this point in her life.  Reports her withdrawal symptoms are easing off, with the exception of feeling dizzy and light headed occasionally.  Objective:Vital Signs:Blood pressure 68/38, pulse 73, temperature 97.2 F (36.2 C), temperature source Oral, resp. rate 14, height 5' 4.5" (1.638 m), weight 107 lb (48.535 kg), last menstrual period 10/20/2011. Pt. Appears profoundly depressed. Voice is soft but more spontaneous than upon admission. Eye contact is only slightly improved. Mood is depressed and affect is congruent. Denies SI/HI and shows no symptoms of psychosis. She also appears uncomfortable due to sciatic pain. She is still on 1:1 and reports that she would speak to the staff if she feels that she is unsafe.  Assessment: Major DD severe                         Alcohol dependence Medications Scheduled:     . ARIPiprazole  10 mg Oral Daily  . buPROPion  300 mg Oral Daily  . FLUoxetine  20 mg Oral Daily  . traZODone  50 mg Oral QHS     PRN Meds acetaminophen, alum & mag hydroxide-simeth, chlordiazePOXIDE, magnesium hydroxide  Plan: Continue 1:1 at this time. Increase fluoxetine to 40mg  and continue the Welbutrin as is.  Situation is discussed with Dr. Javier Glazier who agrees with the above plan. Continue current plan of care with  no changes at this time.  Anticipated D/C is 3-5 days. [name]. [name] [facility] 11/06/2011 2:40 PM

## 2011-11-06 NOTE — Progress Notes (Signed)
1:1 RN documentation.  Patient sitting in the dayroom, sitting up in chair reading a book.  Patient is dressed and groomed.  She easily engages in conversation with staff.  Pleasant and smiling some.  Requested Tylenol for complaints of sciatic pain, which was given.  Remains on 1:1 for safety.  Patient remains safe at this time.

## 2011-11-06 NOTE — Progress Notes (Signed)
[facility] Adult Inpatient Family/Significant Other Suicide Prevention Education  Suicide Prevention Education:  Education Completed; [name] [name], a close friend at [phone], has been identified by the patient as the friend with whom the patient will be most honest with and identified as the person(s) who will aid the patient in the event of a mental health crisis (suicidal ideations/suicide attempt).  With written consent from the patient, [name] [name] has been provided the following suicide prevention education, prior to the and/or following the discharge of the patient.  The suicide prevention education provided includes the following:  Suicide risk factors  Suicide prevention and interventions  National Suicide Hotline telephone number  [facility] assessment telephone number  [facility] Emergency Assistance 911  [facility] and/or Residential Mobile Crisis Unit telephone number  Request made of family/significant other to:  Remove weapons (e.g., guns, rifles, knives), all items previously/currently identified as safety concern.    Remove drugs/medications (over-the-counter, prescriptions, illicit drugs), all items previously/currently identified as a safety concern.  [name] [name] states there are no firearms in the home and she is willing to be with the patient upon discharge for support should she wish to dispose of some of her current medications as [name] [name] states she has a large quantity of medications in the home.   The friend verbalizes understanding of the suicide prevention education information provided.    [name] [name] 11/06/2011, 4:56 PM

## 2011-11-06 NOTE — Progress Notes (Signed)
[facility] Group Notes:  (Counselor/Nursing/MHT/Case Management/Adjunct)  11/06/2011 3:13 PM   Type of Therapy:  Processing Group at 11:00 am  Participation Level:  Did Not Attend   [facility] Group Notes:  (Counselor/Nursing/MHT/Case Management/Adjunct)  11/06/2011 3:13 PM   Type of Therapy:  Counseling Group at 1:15 pm  Participation Level:  Active  Participation Quality:  Appropriate  Affect:  Depressed  Cognitive:  Appropriate  Insight:  Good  Engagement in Group:  Good  Engagement in Therapy:  Good  Modes of Intervention:  Exploration, clarification and Activity  Summary of Progress/Problems:  [name] [name] participated in both group discussion and group exercise.She shared "all my time in AA has ruined my drinking as I push friends away when I plan to drink and use it as a reward for my overwork for others." During activity she choose three photographs to represent balance in her life. One depicted feelings of being overwhelmed and another clearly depicted her struggle with anorexia, while the third represented balance, as she choose photo of female relaxed sitting on park bench and shared "she looks like she is comfortable in her own skin"  Ronda Fairly, LCSWA 11/06/2011 3:19 PM

## 2011-11-06 NOTE — Progress Notes (Signed)
Pt states she slept well, appetite is improving, energy level is low. Focus is improving. Pt rates her depression as a 8, and hopelessness as a 7. Pt denies SI/HI. Pt c/o sciatica back pain, Tylenol given. Pt told to talk to MD re: pain relief, pt rates her pain at a 8. Pt writes her coping skill when she gets home is "not drink". Pt remains on a 1:1 for inability to stay safe on her own on the unit.

## 2011-11-07 MED ORDER — DOCUSATE SODIUM 100 MG PO CAPS
100.0000 mg | ORAL_CAPSULE | Freq: Every day | ORAL | Status: DC
  Administered 2011-11-07 – 2011-11-10 (×4): 100 mg via ORAL
  Filled 2011-11-07 (×5): qty 1

## 2011-11-07 NOTE — Progress Notes (Signed)
Patient ID: [name] [name], female   DOB: 1960-10-31, 51 y.o.   MRN: [id number]    pt. Is app/coop and agrees to contract for safety. She denies si/hi/ha or thoughts of self harm.  Pt. Had a good nap with good effect,  She stated felling much better and maintains a pleasant, friendly affect.  No complaints of pain or dis-comfort pt. Agreed to let staff know of any needed assistance

## 2011-11-07 NOTE — Progress Notes (Signed)
1:1 OBS discontinued at 1215. Post 1:1 observation documentation notes started at 1315.

## 2011-11-07 NOTE — Progress Notes (Signed)
Pt has been calm and cooperative this evening. Pt remains on 1:1 for safety. Patient is denying SI at this time. Pt expresses no concerns she wishes this Clinical research associate to address at this time. Pt interacts with staff appropriately with interactions. Pt was observed in bed reading a book. Support and availability as needed has been extended to this pt. Pt safety remains with q57min checks.

## 2011-11-07 NOTE — Discharge Planning (Signed)
[name] [name] attended AM group, good participation.  Says she is doing better today.  Feels it is related to getting letters delivered [facility].  Hopes to get off of 1:1 today.  I told her about IOP as an option for her at d/c.  Explained it would be a way for her to continue to focus on herself and her needs rather than jumping right back into her 60 hour a week job.

## 2011-11-07 NOTE — Progress Notes (Signed)
[facility] Group Notes:  (Counselor/Nursing/MHT/Case Management/Adjunct)  11/07/2011 12:18 PM  Type of Therapy:  Group Therapy  Participation Level:  Active  Participation Quality:  Appropriate  Affect:  Appropriate  Cognitive:  Oriented  Insight:  Good  Engagement in Group:  Good  Engagement in Therapy:  Good  Modes of Intervention:  Problem-solving, Support and exploration  Summary of Progress/Problems: Pt active in group therapy and was able to explore feelings around "I Am your Diease" about addiction. Pt shared that she has detoxed several times and finds that process easier then" just living day to day" pt shared she feels as if she has screwed up her life so much in the last 5 years and while she goes through periods of being sober -sometimes 11 months at a time- however it not sure if she "has it in her this time" stating she does not want to have to pick up another damn chip and feels guilty about relapse. Pt shared that when she works the [employer]" she does well and is able to fill her drug addiction with her higher power. Pt feeling slightly defeated. Vanetta Mulders, LPCA    Neils Siracusa Garret Reddish 11/07/2011, 12:18 PM

## 2011-11-07 NOTE — Progress Notes (Signed)
[facility] MD Progress Note  11/07/2011 2:17 PM  S/O: Pt seen and evaluated in treatment team.  Reviewed short term and long term goals, medications, current treatment in the hospital and acute/chronic safety.  Pt's mood is improved.  Patient stated that her mood was "a lot better". She denied any current thoughts of self injurious behavior, suicidal ideation or homicidal ideation. There were no auditory or visual hallucinations, paranoia, delusional thought processes, or mania noted.  Thought process was linear and goal directed.  No psychomotor agitation noted.  Energy slightly improved.  Speech was normal rate, tone and volume. Eye contact was good. Judgment and insight are fair.  Patient has been up and engaged on the unit.  No acute safety concerns reported from team.    Sleep:  Number of Hours: 6.25   Vital Signs:Blood pressure 81/52, pulse 80, temperature 97 F (36.1 C), temperature source Oral, resp. rate 16, height 5' 4.5" (1.638 m), weight 48.535 kg (107 lb), last menstrual period 10/20/2011.  Lab Results: No results found for this or any previous visit (from the past 48 hour(s)).  A/P: Alcohol Dependence; Mood Disorder NOS; Anorexia Nervosa; r/o BPD & PTSD  Meds:    . ARIPiprazole  10 mg Oral Daily  . buPROPion  300 mg Oral Daily  . FLUoxetine  40 mg Oral Daily  . traZODone  50 mg Oral QHS  . DISCONTD: FLUoxetine  20 mg Oral Daily    Will continue current meds, Prozac increased yesterday.  Medication education completed.  Pros, cons, risks, potential side effects and benefits were discussed with pt.  Pt agreeable with the plan.  See orders.  Discussed with team.   Discontinued the 1:1 today s/p team and will continue q15 minute checks per unit protocol.  No clinical indication for one on one level of observation at this time.  Pt contracting for safety.  Mental health treatment, medication management and continued sobriety will mitigate against the increased risk of harm to self and/or  others.  Discussed the importance of recovery with pt, as well as, tools to move forward in a healthy & safe manner.  Pt agreeable with the plan.  Discussed with the team.

## 2011-11-07 NOTE — Progress Notes (Signed)
(  1000) Nsg Note for 1:1 observation: D: Pt has been attending groups this morning, smiling. A: Pt given Tylenol per request for sciatic nerve pain.R: Pt states she slept fair, appetite is improving. Pt rates her depression as a 7, and hopelessness as a 7. Pt denies SI/HI. Pt's goal is "don't drink, try not to work so many hours." Pt remains on a 1:1 OBS for inability to verbalize that she would be safe on the unit.

## 2011-11-07 NOTE — Progress Notes (Signed)
[facility] Group Notes:  (Counselor/Nursing/MHT/Case Management/Adjunct)  11/07/2011 2:35 PM  Type of Therapy:  Group Therapy  Participation Level:  Active  Participation Quality:  Appropriate, Attentive and Sharing  Affect:  Appropriate  Cognitive:  Appropriate  Insight:  Good  Engagement in Group:  Good  Engagement in Therapy:  Good  Modes of Intervention:  Problem-solving, Support and exploration  Summary of Progress/Problems: Group explored life after recovery and how to fill one's days in healthy ways to avoid relapse. Pt shared that her drinking is very time consuming and intense as she spends a great deal of time covering up her drinking problem as she states she has a double life and has to appear as if she functions well as work- pt shared how she puts alcohol in her drinks and hides bottles. Pt states that she needs to go back to her program as she has been successful in the past, as will spend more time with her family as well as work less to diminish stress. [name] [name], LPCA    [name] 11/07/2011, 2:35 PM

## 2011-11-07 NOTE — Treatment Plan (Signed)
Interdisciplinary Treatment Plan Update (Adult)  Date: 11/07/2011  Time Reviewed: 10:58 AM   Progress in Treatment: Attending groups: Yes Participating in groups: Yes Taking medication as prescribed: Yes Tolerating medication: Yes   Family/Significant othe contact made:   Patient understands diagnosis:  Yes Discussing patient identified problems/goals with staff:  Yes Medical problems stabilized or resolved:  Yes Denies suicidal/homicidal ideation: Yes Issues/concerns per patient self-inventory:  Pain is decreasing Other:  New problem(s) identified: N/A  Reason for Continuation of Hospitalization: Depression  Interventions implemented related to continuation of hospitalization: Groups  Medication stabilization  Additional comments:D/C 1:1 today  Estimated length of stay:2-3  Discharge Plan: Return home  Possible follow up at IOP  New goal(s): N/A  Review of initial/current patient goals per problem list:   1.  Goal(s):Safely detox from alcohol  Met:  Yes  Target date:  As evidenced by:  2.  Goal (s):Identify comprehensive sobriety plan  Met:  No  Target date:1/21   As evidenced by: Lanora Manis plans to attend AA mtgs.  Still deciding about IOP  3.  Goal(s):Decrease Depression  Met:  No  Target date:1/21  As evidenced [id number] to 3 or less at d/c  4.  Goal(s):Eliminate SI  Met:  Yes  Target date:  As evidenced by:  Attendees: Patient:  [name] [name] 11/07/2011 10:58 AM  Family:     Physician:  Lupe Carney 11/07/2011 10:58 AM   Nursing: Carolynn Comment   11/07/2011 10:58 AM   Case Manager:  Richelle Ito, LCSW 11/07/2011 10:58 AM   Counselor:  Shelda Jakes PA 11/07/2011 10:58 AM   Other:     Other:     Other:     Other:      Scribe for Treatment Team:   Ida Rogue, 11/07/2011 10:58 AM

## 2011-11-08 DIAGNOSIS — F1020 Alcohol dependence, uncomplicated: Secondary | ICD-10-CM | POA: Diagnosis present

## 2011-11-08 MED ORDER — GABAPENTIN 600 MG PO TABS
300.0000 mg | ORAL_TABLET | Freq: Four times a day (QID) | ORAL | Status: DC | PRN
  Administered 2011-11-08 – 2011-11-09 (×5): 300 mg via ORAL
  Administered 2011-11-10: 08:00:00 via ORAL
  Filled 2011-11-08 (×6): qty 1

## 2011-11-08 NOTE — Progress Notes (Signed)
Patient ID: [name] [name], female   DOB: 12-26-1960, 51 y.o.   MRN: [id number]  Patient has been very flat and depressed on the unit. Pt has been in her room most of the day, and has complained of pain. Pt did complain of an headache and was given Tylenol, for which she got some relief. Pt reported being negative SI/HI, no AH/VH noted.

## 2011-11-08 NOTE — Progress Notes (Signed)
Discharge Note: 9:38 AM 11/08/2011 Pt reports no SI/HI. Pt given suicide prevention info and understands who is at risk, warning signs, what to do and who to call. Pt upset as her pain in her back has not been addressed-pt would like to D/C or be sent to emergency room for back pain. Pt would like to follow up with IOP here at [facility]. Pt reports her energy is still low. Kemuel Buchmann, LPCA

## 2011-11-08 NOTE — Progress Notes (Signed)
Pt. is in bed resting quietly with her eyes closed turned to her right side.

## 2011-11-08 NOTE — Progress Notes (Signed)
  [name] [name] is a 51 y.o. female [id number] 1961-01-24  11/04/2011 Principal Problem:  *Alcohol dependence   Mental Status:  alert & oriented says her mood is improved. Feels more hopeful.No longer wants to hurt herself.    Subjective/Objective:  Asking for something for R sciatic nerve pain.No evidence for alcohol withdrawl    Filed Vitals:   11/08/11 0701  BP: 71/39  Pulse: 72  Temp:   Resp:     Lab Results:   BMET    Component Value Date/Time   NA 136 11/03/2011 1938   K 4.0 11/03/2011 1938   CL 103 11/03/2011 1938   CO2 24 11/03/2011 1938   GLUCOSE 89 11/03/2011 1938   BUN 14 11/03/2011 1938   CREATININE 0.92 11/03/2011 1938   CALCIUM 9.1 11/03/2011 1938   GFRNONAA 71* 11/03/2011 1938   GFRAA 83* 11/03/2011 1938    Medications:  Scheduled:     . ARIPiprazole  10 mg Oral Daily  . buPROPion  300 mg Oral Daily  . docusate sodium  100 mg Oral Daily  . FLUoxetine  40 mg Oral Daily  . traZODone  50 mg Oral QHS     PRN Meds acetaminophen, alum & mag hydroxide-simeth, chlordiazePOXIDE, gabapentin, magnesium hydroxide  Plan: Start Neurontin 300 mg Q4h prn sciatic pain.Consider IOP Monday.  Francesca Strome,MICKIE D. 11/08/2011

## 2011-11-08 NOTE — Progress Notes (Signed)
Patient ID: [name] [name], female   DOB: 05-14-1961, 51 y.o.   MRN: [id number]   [facility] Group Notes:  (Counselor/Nursing/MHT/Case Management/Adjunct)  11/08/2011 1:15 PM  Type of Therapy:  Group Therapy, Dance/Movement Therapy   Participation Level:  Active  Participation Quality:  Appropriate  Affect:  Flat  Cognitive:  Appropriate  Insight:  Limited  Engagement in Group:  Good  Engagement in Therapy:  Good  Modes of Intervention:  Clarification, Problem-solving, Role-play, Socialization and Support  Summary of Progress/Problems:  Pt shared that she feels like a snake when she is angry because she is slow to anger, but when she is, she has a "venomous bite." Group conversed about how to deal with negative self-talk and supports who are negative by instead using health, positive coping skills. Pts practiced a full body, breathing technique that encourages them to inhale positive energy and to exhale negative energy. Pts agreed to use this technique during quiet time today.  [name] [name], [employer]

## 2011-11-09 LAB — VITAMIN D 1,25 DIHYDROXY
Vitamin D 1, 25 (OH)2 Total: 34 pg/mL (ref 18–72)
Vitamin D2 1, 25 (OH)2: <8 pg/mL
Vitamin D3 1, 25 (OH)2: 34 pg/mL

## 2011-11-09 MED ORDER — LIDOCAINE 5 % EX PTCH
1.0000 | MEDICATED_PATCH | CUTANEOUS | Status: DC
  Administered 2011-11-09: 1 via TRANSDERMAL
  Filled 2011-11-09 (×2): qty 1

## 2011-11-09 NOTE — Progress Notes (Signed)
[facility] Group Notes:  (Counselor/Nursing/MHT/Case Management/Adjunct)  11/09/2011 1315PM  Type of Therapy:  Group Therapy  Participation Level:  Active  Participation Quality:  Appropriate  Affect:  Appropriate  Cognitive:  Appropriate  Insight:  Good  Engagement in Group:  Good  Engagement in Therapy:  Good  Modes of Intervention:  Activity, Clarification, Problem-solving and Support  Summary of Progress/Problems: Pt. participated in group session on supports. Pt. was asked what support means to them, who are their supports, what is the difference between unhealthy and healthy supports and what they can do when their support is not there. Pt. Identified her four friends as supporters. Pt. Shared that her family lives in [place] and it is hard to consider them a support because they are too far away.   [name] [name] 11/09/2011, 2:58 PM

## 2011-11-09 NOTE — Progress Notes (Signed)
Pt positive for wrap up group, interacting appropriately on unit.  Pt states that her sciatic nerve pain is not helped by the neurontin and would like the doctor to consider other medication.  She states that the pain never gets less than 5 but is usually much higher.  Support and encouragement offered, medication given as ordered.  Will continue to monitor.

## 2011-11-09 NOTE — Progress Notes (Signed)
  [name] [name] is a 51 y.o. female [id number] 25-Nov-1960  11/04/2011 Principal Problem:  *Alcohol dependence   Mental Status: alert and oriented not suicidal homicidal no longer detoxing.    Subjective/Objective: Moving easily and affect brighter. Says she will have to go to Pain management for her R sciatic nerve ( reported pain is not congruent with her presentation) and anticipates doing CD-IOP here. Hopes for discharge tomorrow.    Filed Vitals:   11/09/11 0742  BP: 90/61  Pulse: 79  Temp:   Resp:     Lab Results:   BMET    Component Value Date/Time   NA 136 11/03/2011 1938   K 4.0 11/03/2011 1938   CL 103 11/03/2011 1938   CO2 24 11/03/2011 1938   GLUCOSE 89 11/03/2011 1938   BUN 14 11/03/2011 1938   CREATININE 0.92 11/03/2011 1938   CALCIUM 9.1 11/03/2011 1938   GFRNONAA 71* 11/03/2011 1938   GFRAA 83* 11/03/2011 1938    Medications:  Scheduled:     . ARIPiprazole  10 mg Oral Daily  . buPROPion  300 mg Oral Daily  . docusate sodium  100 mg Oral Daily  . FLUoxetine  40 mg Oral Daily  . traZODone  50 mg Oral QHS     PRN Meds acetaminophen, alum & mag hydroxide-simeth, chlordiazePOXIDE, gabapentin, magnesium hydroxide  Plan:  Continue cuurent plan of care  [name] [name],[name] [name]. 11/09/2011

## 2011-11-09 NOTE — Progress Notes (Signed)
Patient ID: [name] [name], female   DOB: 1961-10-05, 51 y.o.   MRN: [id number]  Pt has been very flat and depressed on the unit, and continues to complain of back pain. Pt continues to report on her self inventory sheet that she is depressed and continues to feel hopeless. Pt was seen by the doctor today regarding her back pain, and reported that she was not getting any relief from the Neurontin that she has been taking. The doctor wrote orders for her to have a Lidocaine patch, patch was applied. Pt reported being negative SI/HI, no AH/VH noted.

## 2011-11-09 NOTE — Progress Notes (Signed)
Pt continues to have sciatic nerve pain and rates it a 10/10.  Pt states that the lidocaine patch has not been helpful.  Pt given Tylenol with neurontin as ordered for pain.  Pt had no further complaints, no acute distress noted.  Pt in dayroom with other Pts watching football game.  Pt was positive for wrap-up group and is interacting appropriately on unit.  Support and encouragement given, will continue to monitor.

## 2011-11-10 ENCOUNTER — Ambulatory Visit
Admission: RE | Admit: 2011-11-10 | Discharge: 2011-11-10 | Disposition: A | Discharge status: Home / Self Care | Payer: BC Managed Care – PPO | Source: Ambulatory Visit | Attending: Family Medicine | Admitting: Family Medicine

## 2011-11-10 ENCOUNTER — Other Ambulatory Visit: Payer: Self-pay | Admitting: Family Medicine

## 2011-11-10 DIAGNOSIS — R52 Pain, unspecified: Secondary | ICD-10-CM

## 2011-11-10 MED ORDER — BUPROPION HCL ER (XL) 300 MG PO TB24: 300.0000 mg | ORAL_TABLET | Freq: Every day | ORAL | Status: DC

## 2011-11-10 MED ORDER — ARIPIPRAZOLE 10 MG PO TABS: 10.0000 mg | ORAL_TABLET | Freq: Every day | ORAL | Status: DC

## 2011-11-10 MED ORDER — FLUOXETINE HCL 40 MG PO CAPS: 40.0000 mg | ORAL_CAPSULE | Freq: Every day | ORAL | Status: DC

## 2011-11-10 MED ORDER — TRAZODONE HCL 50 MG PO TABS: 50.0000 mg | ORAL_TABLET | Freq: Every day | ORAL | Status: DC

## 2011-11-10 MED ORDER — GABAPENTIN 600 MG PO TABS: 300.0000 mg | ORAL_TABLET | Freq: Four times a day (QID) | ORAL | Status: DC | PRN
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
## 2011-11-10 NOTE — Treatment Plan (Signed)
Interdisciplinary Treatment Plan Update (Adult)  Date: 11/10/2011  Time Reviewed: 10:26 AM   Progress in Treatment: Attending groups: Yes Participating in groups: Yes Taking medication as prescribed: Yes Tolerating medication: Yes   Family/Significant other contact made: Yes  Contacted friend for suicide prevention  Patient understands diagnosis:  Yes Discussing patient identified problems/goals with staff:  Yes Medical problems stabilized or resolved:  Yes Denies suicidal/homicidal ideation: Yes Issues/concerns per patient self-inventory:   Other:  New problem(s) identified: N/A  Reason for Continuation of Hospitalization: Other; describe None D/C today  Interventions implemented related to continuation of hospitalization:   Additional comments:  Estimated length of stay:D/C today  Discharge Plan:Return home Follow up with [facility] CDIOP  New goal(s): N/A  Review of initial/current patient goals per problem list:   1.  Goal(s):Safely detox from alcohol   Met:  Yes  Target date:  As evidenced by:  2.  Goal (s):Identify comprehensive d/c plan  Met:  Yes  Target date:  As evidenced by:  3.  Goal(s):Eliminate SI  Met:  Yes  Target date:  As evidenced by:  4.  Goal(s):Decrease depression  Met:  Yes  Target date:  As evidenced by:  Attendees: Patient: [name] [name]  11/10/2011 10:26 AM  Family:     Physician:  Lupe Carney 11/10/2011 10:26 AM   Nursing: Carolynn Comment   11/10/2011 10:26 AM   Case Manager:  Richelle Ito, LCSW 11/10/2011 10:26 AM   Counselor:  Ronda Fairly, LCSWA 11/10/2011 10:26 AM   Other: Shelda Jakes  11/10/2011 10:26 AM  Other:     Other:     Other:      Scribe for Treatment Team:   Ida Rogue, 11/10/2011 10:26 AM

## 2011-11-10 NOTE — Progress Notes (Signed)
[facility] Group Notes:  (Counselor/Nursing/MHT/Case Management/Adjunct)  11/10/2011 1:04 PM   Type of Therapy:  Processing Group at 11:00 am  Participation Level:  Active  Participation Quality:  Appropriate attentive and sharing and supportive of others  Affect:  Appropriate  Cognitive:  Appropriate  Insight:  Good  Engagement in Group:  Good  Engagement in Therapy:  Good  Modes of Intervention:  Exploration, clarification,  reality testing  Summary of Progress/Problems:  Abena shared she is feeling like she is doing well all inpatient yet noticed that when she discharged as it'll be different. Patient identified with HALT acronym;"I feel like I lived my life hungry lonely and tired I do a lot of things to deal with the anger but with an eating disorder usually hungry lonely and tired"  Writer questioned her anger: how she manages that or doesshe just turned inward and that relates to her depression.  [facility] Group Notes:  (Counselor/Nursing/MHT/Case Management/Adjunct)  11/10/2011 1:04 PM   Type of Therapy:  Counseling Group at 1:15 pm  Participation Level:   IsMinimal  Participation Quality:  Attentive  Affect:  Appropriate  Cognitive:  Appropriate  Engagement in Group:  Good  Modes of Intervention:  Education on services provided by [facility] and [place]  Summary of Progress/Problems:  Aryn was attentive during brief description about local services offered by [facility]. Volunteer was a no-show; description of services was provided by Clinical research associate.   Ronda Fairly, LCSWA 11/10/2011 1:47 PM

## 2011-11-10 NOTE — Progress Notes (Signed)
[facility] Case Management Discharge Plan:  Will you be returning to the same living situation after discharge: Yes,  home At discharge, do you have transportation home?:Yes,  friend Do you have the ability to pay for your medications:Yes,  insurance  Interagency Information:     Release of information consent forms completed and in the chart;  Patient's signature needed at discharge.  Patient to Follow up at:  Follow-up Information    Follow up with [facility] on 11/11/2011. (3:00 PM for your assessment)    Contact information:   [address] [place] [336] [phone]      Follow up with [facility]. (I left Mardelle Matte a message asking him to contact you while your are in the IOP program)    Contact information:   [phone]         Patient denies SI/HI:   Yes,  yes    Safety Planning and Suicide Prevention discussed:  Yes,  yes  Barrier to discharge identified:No.  Summary and Recommendations:   Ida Rogue 11/10/2011, 9:25 AM

## 2011-11-10 NOTE — Discharge Summary (Signed)
[name] [name] [birth year] 51 y.o.  [id number]     Date of Admission:  11/04/2011 Date of Discharge:  11/10/2011 Diagnosis: AXIS I Alcohol Dependence; Mood Disorder NOS; Anorexia Nervosa   AXIS II Deferred  AXIS III Past Medical History  Diagnosis Date  .       AXIS IV economic problems, occupational problems and problems with primary support group  AXIS V 51-60 moderate symptoms    HPI: [name] [name] was admitted to [facility] from ED where she presented with alcohol intoxication and reported symptoms of suicidal ideation.  Hospital Course:      The duration of [name] [name]"s stay at [facility] was unremarkable.      The patient was seen and evaluated by the Treatment team consisting of Psychiatrist, PAC, RN, Case Manager, and Therapist for evaluation and treatment plan with goal of stabilization upon discharge. The patient's physical and mental health problems were identified and treated appropriately.      Multiple modalities of treatment were used including medication, individual and group therapies, unit programming, AA/NA, improved nutrition, physical activity, and family sessions as needed.     The symptoms of alcohol/substance abuse withdrawal were monitored daily by serial clinical withdrawal scores. Improvement was demonstrated by declining CIWA/COWS numbers, improving vital signs, increased cognition, and improvement in mood, sleep, appetite as well as a reduction in psychosocial symptoms.       The patient was evaluated and found to be stable enough for discharge and was released to home per the initial plan of treatment.   Mental Status Exam:  For mental status exam please see mental status exam and  suicide risk assessment completed by attending physician prior to discharge. BP 95/58  Pulse 72  Temp(Src) 97 F (36.1 C) (Oral)  Resp 16  Ht 5' 4.5" (1.638 m)  Wt 107 lb (48.535 kg)  BMI 18.08 kg/m2  LMP 10/20/2011 Labs: Level of Care:  OP  Meds on Discharge: Current Discharge  Medication List    START taking these medications   Details  gabapentin (NEURONTIN) 600 MG tablet Take 0.5 tablets (300 mg total) by mouth every 6 (six) hours as needed (for r sciatic nerve pain ). Qty: 30 tablet, Refills: 0      CONTINUE these medications which have CHANGED   Details  ARIPiprazole (ABILIFY) 10 MG tablet Take 1 tablet (10 mg total) by mouth daily. As adjuvant treatment for depression. Qty: 30 tablet, Refills: 0    buPROPion (WELLBUTRIN XL) 300 MG 24 hr tablet Take 1 tablet (300 mg total) by mouth daily. Qty: 30 tablet, Refills: 0    FLUoxetine (PROZAC) 40 MG capsule Take 1 capsule (40 mg total) by mouth daily. Qty: 30 capsule, Refills: 0    traZODone (DESYREL) 50 MG tablet Take 1 tablet (50 mg total) by mouth at bedtime. Qty: 30 tablet, Refills: 0      STOP taking these medications     chlordiazePOXIDE (LIBRIUM) 25 MG capsule      topiramate (TOPAMAX) 25 MG tablet        Is patient on multiple antipsychotic therapies at discharge:  No   Has Patient had three or more failed trials of antipsychotic monotherapy by history:  No  Follow-up recommendations:  Other:  Keep follow up appointments as planned. Discharge destination:  Home Comments: Please keep all follow up appointments as scheduled. [name] [name]. [name] [name] PAC for DR.Alyson Kuroski-MazzieMD 11/10/2011

## 2011-11-11 ENCOUNTER — Encounter (HOSPITAL_COMMUNITY): Payer: Self-pay | Admitting: Psychology

## 2011-11-11 ENCOUNTER — Other Ambulatory Visit: Payer: Self-pay | Admitting: Family Medicine

## 2011-11-11 DIAGNOSIS — R29898 Other symptoms and signs involving the musculoskeletal system: Secondary | ICD-10-CM

## 2011-11-11 NOTE — Progress Notes (Signed)
Patient Discharge Instructions:  Admission Note Faxed,  11/11/2011 After Visit Summary Faxed,  11/11/2011 Faxed to the Next Level Care provider:  11/11/2011 D/C Summary faxed 11/11/2011 Facesheet faxed 11/11/2011   Faxed to [facility] - Mardelle Matte @ [phone] And to [facility] - Charmian Muff @ [phone]  Wandra Scot, 11/11/2011, 5:25 PM

## 2011-11-11 NOTE — Progress Notes (Signed)
Patient ID: [name] [name], female   DOB: Jan 04, 1961, 51 y.o.   MRN: [id number] Orientation to CD-IOP. The patient is a 51 yo separated, caucasian, female seeking entry into the CD-IOP. She was discharged from [facility] yesterday after completing a 6-day alcohol detox. She lives in [place] with her 74 yo daughter. She has been separated from her husband for almost 2 years and they are in the process of finalizing the divorce. The patient reports a long history of alcohol use. She had been in recovery, but relapsed on New Year's Eve and continued drinking until she entered [facility] on Wednesday, January 16th. The patient is active in the 12-step community and reports she attends 2 AA meetings per day. She is working the Steps for her 3rd time with her sponsor. 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 Despite having worked for years and putting in 60 hours per week, she has never been paid for her efforts. In addition to her alcohol dependence, the patient reports she struggles with anorexia and this past summer she got down to 86 lbs. She admitted she feels "huge" at her current weight of 115 lbs. She noted she has struggled with her weight since she was 51 yo. The patient grew up in [place] and was raised in a very dysfunctional home with mental illness and a history of alcoholism among her paternal and maternal grandparents. The patient reports she moved her with her husband in 2003 when he was transferred for work. It was difficult at first, but she reports she has many good friends in the community. In addition to her 67 yo daughter who attends [employer], hospital and is a serious Teacher, music, she has a 85 yo son who is attending [employer]. She reports a good relationship with both children and a civil caring relationship with her estranged husband.  The patient's psychiatrist is Meredith Staggers and she meets with her therapist, Marliss Czar, once a week. She reported she had worked with Mardelle Matte for a number of years and he had proven very helpful. The documentation was reviewed, signatures obtained and the orientation completed accordingly.

## 2011-11-12 ENCOUNTER — Ambulatory Visit: Admission: RE | Admit: 2011-11-12 | Payer: Self-pay | Source: Ambulatory Visit

## 2011-11-12 ENCOUNTER — Ambulatory Visit
Admission: RE | Admit: 2011-11-12 | Discharge: 2011-11-12 | Disposition: A | Discharge status: Home / Self Care | Payer: BC Managed Care – PPO | Source: Ambulatory Visit | Attending: Family Medicine | Admitting: Family Medicine

## 2011-11-12 DIAGNOSIS — R29898 Other symptoms and signs involving the musculoskeletal system: Secondary | ICD-10-CM

## 2011-11-12 MED ORDER — IOHEXOL 180 MG/ML  SOLN
1.0000 mL | Freq: Once | INTRAMUSCULAR | Status: AC | PRN
  Administered 2011-11-12: 1 mL via EPIDURAL

## 2011-11-12 MED ORDER — METHYLPREDNISOLONE ACETATE 40 MG/ML INJ SUSP (RADIOLOG
120.0000 mg | Freq: Once | INTRAMUSCULAR | Status: AC
  Administered 2011-11-12: 120 mg via EPIDURAL

## 2011-11-21 ENCOUNTER — Other Ambulatory Visit (HOSPITAL_COMMUNITY): Payer: BC Managed Care – PPO | Attending: Physician Assistant

## 2011-11-21 ENCOUNTER — Encounter (HOSPITAL_COMMUNITY): Payer: Self-pay | Admitting: Psychology

## 2011-11-21 DIAGNOSIS — F1020 Alcohol dependence, uncomplicated: Secondary | ICD-10-CM | POA: Insufficient documentation
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
## 2011-11-24 ENCOUNTER — Other Ambulatory Visit (HOSPITAL_COMMUNITY): Payer: BC Managed Care – PPO | Attending: Physician Assistant | Admitting: Psychology

## 2011-11-24 ENCOUNTER — Encounter (HOSPITAL_COMMUNITY): Payer: Self-pay | Admitting: Psychology

## 2011-11-24 DIAGNOSIS — F1920 Other psychoactive substance dependence, uncomplicated: Secondary | ICD-10-CM

## 2011-11-24 DIAGNOSIS — F1020 Alcohol dependence, uncomplicated: Secondary | ICD-10-CM | POA: Insufficient documentation

## 2011-11-25 LAB — ALCOHOL METABOLITE (ETG), URINE: Ethyl Glucuronide (EtG): NEGATIVE ng/mL

## 2011-11-25 LAB — PRESCRIPTION ABUSE MONITORING 17P, URINE
Cocaine Metabolites: NEGATIVE ng/mL
Fentanyl, Ur: POSITIVE ng/mL — ABNORMAL HIGH
MDMA URINE: NEGATIVE ng/mL
Methadone Screen, Urine: NEGATIVE ng/mL
Opiate Screen, Urine: NEGATIVE ng/mL
Oxycodone Screen, Ur: NEGATIVE ng/mL
Tramadol Scrn, Ur: NEGATIVE ng/mL
Zolpidem, Urine: NEGATIVE ng/mL

## 2011-11-25 NOTE — Progress Notes (Signed)
    Daily Group Progress Note  Program: CD-IOP   Group Time: 1-2:30 pm  Participation Level: Active  Behavioral Response: Appropriate and Sharing  Type of Therapy: Process Group  Topic: Group Process; first half of group was spent in process. Members shared issues and concerns that were currently troubling them. There was good feedback and disclosure among the members.      Group Time: 2:45-4 pm  Participation Level: Active  Behavioral Response: Appropriate and Sharing  Type of Therapy: Psycho-education Group  Topic:The Progressive Disease of Addiction:  a presentation was provided identifying the progressive nature of addiction. A handout accompanied the presentation and group members participated in identifying their own experiences with the symptoms listed on the handout. All eight members were able to concur with the symptoms almost down to the bottom of the progression, which included "complete abandonment".  The insanity of continued efforts to hide use despite the obvious changes in personhood was acknowledged by almost everyone.     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 She wondered whether this was one of the stages of grief that she was workiing through. She reported she had attended the service for the last time yesterday and would not be returning. After giving them 8 years of many hours of service, she could not possibly return after her position is gone. In the second half of group, the patient identified easily with most of the identified symptoms on the handout on addiction. She was attentive and very open about her illness and responded well to this intervention.    Family Program: Family present? No   Name of family member(s):   UDS collected: Yes Results:not back yet  AA/NA attended?: YesMonday, Tuesday, Wednesday, Thursday, Friday, Saturday and Sunday  Sponsor?: Yes   Hermann Dottavio,  LCAS

## 2011-11-26 ENCOUNTER — Other Ambulatory Visit (HOSPITAL_COMMUNITY): Payer: BC Managed Care – PPO | Attending: Physician Assistant | Admitting: Psychology

## 2011-11-26 LAB — BENZODIAZEPINES (GC/LC/MS), URINE
Estazolam (GC/LC/MS), ur confirm: NEGATIVE NG/ML
Flurazepam metabolite (GC/LC/MS), ur confirm: NEGATIVE NG/ML
Lorazepam (GC/LC/MS), ur confirm: NEGATIVE NG/ML
Midazolam (GC/LC/MS), ur confirm: NEGATIVE NG/ML
Temazepam (GC/LC/MS), ur confirm: NEGATIVE NG/ML

## 2011-11-26 LAB — AMPHETAMINES (GC/LC/MS), URINE
Amphetamine GC/MS Conf: NEGATIVE NG/ML
MDA GC/MS confirm: NEGATIVE NG/ML
MDEA GC/MS Conf: NEGATIVE NG/ML
MDMA GC/MS Conf: NEGATIVE NG/ML

## 2011-11-26 LAB — FENTANYL (GC/LC/MS), URINE: Norfentanyl, confirm: NEGATIVE NG/ML
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
## 2011-11-27 NOTE — Progress Notes (Signed)
    Daily Group Progress Note  Program: CD-IOP   Group Time: 1-2:30 pm  Participation Level: Active  Behavioral Response: Appropriate and Sharing  Type of Therapy: Psycho-education Group  Topic: The Wheel of Life: Session spent in evaluating the different aspects of one's life and how he/she is doing in each area. Group members were provided with a handout and map and they were asked to draw where they stood in each of the 8 identified categories. They then presented them by drawing a wheel on the board. Members shared about how they were doing in each category and were challenged to identify what they intended to do to improve those areas where they were lacking or not being satisfied. There was good disclosure among group members.     Group Time: 2:45-4 pm  Participation Level: Active  Behavioral Response: Appropriate and Sharing  Type of Therapy: Process Group  Topic: Second half of session spent in process. It was so warm the group voted to sit out in the courtyard. The group pulled up 2 picnic tables and shared about current issues. The new member introduced himself and described his descent into alcoholism. Another shared that she had broken up with her boyfriend and although she felt it was right, she was, admittedly, struggling. There was good feedback and members felt 'heard'.    Summary: The patient shared her 'wheel' with the group. She identified her personal/romantic life and career as being 2 areas where she is very lacking. She admitted she didn't spend as much time on recreation and fun. Later on in the session, the patient shared more about her body image and how when someone tells her she looks healthy, she interprets that as being 'code' for fat. This was confirmed by another group member who also struggles with her body and weight. This patient has developed an unspoken understanding with this other group member - both of whom appear extremely slender, but feel fat.  The patient welcomed the new group member and noted she knew him from 'the rooms'. She responded well to this intervention.        Family Program: Family present? No   Name of family member(s):   UDS collected: No Results:   AA/NA attended?: YesMonday, Tuesday, Wednesday, Thursday, Friday, Saturday and Sunday  Sponsor?: Yes   Malcom Selmer, LCAS

## 2011-11-28 ENCOUNTER — Other Ambulatory Visit (HOSPITAL_COMMUNITY): Payer: BC Managed Care – PPO | Attending: Physician Assistant | Admitting: Psychology

## 2011-11-28 DIAGNOSIS — F1920 Other psychoactive substance dependence, uncomplicated: Secondary | ICD-10-CM | POA: Insufficient documentation

## 2011-11-28 DIAGNOSIS — R630 Anorexia: Secondary | ICD-10-CM | POA: Insufficient documentation

## 2011-11-28 DIAGNOSIS — F1020 Alcohol dependence, uncomplicated: Secondary | ICD-10-CM | POA: Insufficient documentation

## 2011-12-01 ENCOUNTER — Other Ambulatory Visit (HOSPITAL_COMMUNITY): Payer: BC Managed Care – PPO | Admitting: Psychology

## 2011-12-01 DIAGNOSIS — F1920 Other psychoactive substance dependence, uncomplicated: Secondary | ICD-10-CM

## 2011-12-01 NOTE — Progress Notes (Signed)
    Daily Group Progress Note  Program: CD-IOP   Group Time: 1-2:30 pm  Participation Level: Active  Behavioral Response: Sharing  Type of Therapy: Psycho-education Group  Topic:Nutrition; the importance of a good diet in early recovery. A presentation was made on the importance of nutrition in enhancing one's recovery. A discussion ensued about the different food groups and the effects they have on blood sugar levels. The problems with irritability, fatigue, and lack of focus were identified and some group members correctly pointed the acronym, "HALT" and how that seemed to apply directly to diet. The session was insightful with most of the members present lacking in proper nutrition.       Group Time: 2:45- 4pm  Participation Level: Active  Behavioral Response: Appropriate  Type of Therapy: Process Group  Topic: Group process: second half of group spent in process. Group members were invited to travel to [facility] and attend the play, "Vagina Monologues". All of the females and 1 female group member agreed to meet on Saturday evening and [place] together to [place] As the session concluded, members shared their weekend plans. The group members with the most experience and longevity in recovery agreed that they had to make specific plans and commitments on the weekends and identified boredom or no accountability as a problem for them in remaining sober.    Summary:The patient was asked, as all group members had been, to share what she had eaten today. She confirmed that she had woken up at 5 am and run her usual 3 miles at 5:30 this morning. She explained she had been running late to meet a friend for coffee and had not had any breakfast. She reported having had an energy bar for lunch. I pointed out that she had consumed probably 200 calories at the most and still needed to get in about 1400 to maintain homeostasis within her body. She challenged this response and felt like it was  inaccurate. She did admit that in [place] last summer she and her son had hiked 5 - 14,000 foot mountains in 6 days and she had really needed the extra energy derived from 3 energy bars on her hike one day. The patient agreed with another member that she has to plan her weekends and does not risk having made no commitments or being unaccountable because that leads to problems. She agreed to meet on Saturday and go with the group to the play in [place].     Family Program: Family present? No   Name of family member(s):   UDS collected: No Results:   AA/NA attended?: YesMonday, Tuesday, Wednesday, Thursday, Friday, Saturday and Sunday  Sponsor?: Yes   Elmarie Devlin, LCAS

## 2011-12-02 LAB — PRESCRIPTION ABUSE MONITORING 17P, URINE
Benzodiazepine Screen, Urine: NEGATIVE ng/mL
Buprenorphine, Urine: NEGATIVE ng/mL
Cannabinoid Scrn, Ur: NEGATIVE ng/mL
Cocaine Metabolites: NEGATIVE ng/mL
Creatinine, Urine: 66.03 mg/dL
MDMA URINE: NEGATIVE ng/mL
Meperidine, Ur: NEGATIVE ng/mL
Methadone Screen, Urine: NEGATIVE ng/mL
Tapentadol, urine: NEGATIVE ng/mL
Tramadol Scrn, Ur: NEGATIVE ng/mL

## 2011-12-02 NOTE — Progress Notes (Signed)
    Daily Group Progress Note  Program: CD-IOP   Group Time: 1-2:30 pm  Participation Level: Active  Behavioral Response: Appropriate and Sharing  Type of Therapy: Psycho-education Group  Topic: Chaplain: First half of group was spent with a chaplain. Donnelly Stager with [place] had come today to address the group and discuss spirituality. She provided a handout and talked about a "Soul Purpose Exercise", which included creating a collage. There was good disclosure among group members and one member shared that she had one of her many collages in her car. She brought the work down and a lively discussion ensued. The presentation by the chaplain proved very powerful and generated excellent plans for future exercises of this sort.     Group Time: 2:45-4 pm  Participation Level: Active  Behavioral Response: Appropriate  Type of Therapy: Process Group  Topic: Group Process: second half of group spent in process. Members discussed issues or concerns that t   y were currently struggling with. The group had a PA from [facility] sitting in today and she introduced herself. This brought up a good discussion and details of her life, including her struggles with Type 1 Diabetes.    Summary: The patient was attentive and reported that her view of spirituality was about connection. She appeared very interested in the collage work and asked a number of questions. Halfway through the presentation, this patient was asked to meet with the Medical Director for her initial evaluation. She returned with about 20 minutes of the presentation remaining. The patient reported she would really enjoy putting together a collage, but admitted she would much rather do it at home and bring it in completed. She stated she would prefer to complete the assignment on her own than here in the group room with everyone working on their own collage. In process, the patient reported she had had a tough day after  cleaning out her desk at work and she felt like she really needed to attend an AA meeting.   Family Program: Family present? No   Name of family member(s):   UDS collected: Yes Results: not available  AA/NA attended?: YesMonday, Tuesday, Wednesday, Thursday, Friday, Saturday and Sunday  Sponsor?: Yes   Antionetta Ator, LCAS

## 2011-12-03 ENCOUNTER — Other Ambulatory Visit (HOSPITAL_COMMUNITY): Payer: BC Managed Care – PPO | Admitting: Psychology

## 2011-12-04 NOTE — Progress Notes (Signed)
    Daily Group Progress Note  Program: CD-IOP   Group Time: 1-2:30 pm  Participation Level: Active  Behavioral Response: Appropriate  Type of Therapy: Psycho-education Group  Topic: The Neurobiology of Addiction: First half of group was spent watching a DVD on the brain chemistry of addiction and the manner in which chemically dependent people's brains are different. The information was very compelling and the group was very attentive during the showing. Almost every group member could relate to some characteristic noted in the video and every member reported he/she had benefited from this piece.     Group Time: 2:45- 4pm  Participation Level: Active  Behavioral Response: Appropriate and Sharing  Type of Therapy: Process Group  Topic: Group Process; Second half of group was spent in process. Members shared about their current issues and struggles. One member returned after having missed 3 sessions and shared that her husband had asked her to leave their house. She cried as she talked about moving to her parent's home, but noted it was for the best and agreed she would be able to focus better on her recovery. Comments continued about the impact of the DVD and how members were able to look back and understand their history of drug use more clearly.   Summary: The patient reported she had awoken today and felt 'squirrely'. I reminded her that this was to be expected, considering her life is changing so much with a job loss and the resultant free time and lack of constant concerns. I wondered whether she had run this morning at 5:30 as is her usual compulsive behavior? She admitted she had overslept and I applauded this news. She didn't understand why I would be doing that and admitted she couldn't run later because she had a meeting. She admitted she felt awful having not gotten in her daily run. The patient was able to confirm what the DVD said about that 'first time'. she stated that  she first drank when she was 51 yo and she felt very much complete or whole with that alcohol. She could also attest to the high tolerance she had at a very early age due to her genetic predisposition. The patient made some excellent comments and responded well to this intervention.   Family Program: Family present? No   Name of family member(s):   UDS collected: No Results:   AA/NA attended?: YesMonday, Tuesday, Wednesday, Thursday, Friday, Saturday and Sunday  Sponsor?: Yes   Zaiah Eckerson, LCAS

## 2011-12-05 ENCOUNTER — Other Ambulatory Visit (HOSPITAL_COMMUNITY): Payer: BC Managed Care – PPO | Admitting: Psychology

## 2011-12-08 ENCOUNTER — Other Ambulatory Visit (HOSPITAL_COMMUNITY): Payer: BC Managed Care – PPO | Admitting: Psychology

## 2011-12-08 NOTE — Progress Notes (Signed)
    Daily Group Progress Note  Program: CD-IOP   Group Time: 1-2:30 pm  Participation Level: Active  Behavioral Response: Appropriate  Type of Therapy: Activity Group  Topic: Introductory Yoga Class: first half of group spent down in the gymnasium with a Air traffic controller. Group members sat on yoga mats and followed the yoga instructor in an introductory yoga class. There were 8 positions practiced. The group seemed very pleased and energized by this activity. Upon completion the group returned to the group room where the instructor shared about her own life in recovery and how she had begun practicing yoga after entering treatment for her alcohol dependence. There was good discussion and questions from group members. Handouts were provided and members encouraged to practice these poses, or asanas, between now and when she returns for another yoga class.      Group Time: 2:45- 4 pm  Participation Level: Active  Behavioral Response: Appropriate and Sharing  Type of Therapy: Process Group  Topic:Second half of group was spent in group process. Members discussed current issues and concerns in early recovery. Some noted progress they have made while others shared disappointments with early recovery or failure to maintain sobriety. One member shared her frustration and hurt by a young man she had been seeing and the bigger issue, of codependence, was discussed. Many of the group members admitted they were very codependent and had problems leaving relationships despite their dysfunctional nature. As the session neared completion, members discussed their plans for the upcoming weekend.    Summary: The patient had done yoga before and enjoyed the session. She made some good observations during the ensuing discussion. In process, the patient admitted she had been in a very dysfunctional affair and it had been like an 'addiction' itself. She could understand the fears of her fellow group member.  When asked about her weekend, the patient reported she had very little planned this weekend. I pointed out that this might be a 'red flag' and she should insure that she makes plans before tomorrow. She stated she was going to meet with her sponsor sometime and she would definitely be attending some AA meetings.     Family Program: Family present? No   Name of family member(s):   UDS collected: No Results:   AA/NA attended?: YesMonday, Tuesday, Wednesday, Thursday, Friday, Saturday and Sunday  Sponsor?: Yes   Laderrick Wilk, LCAS

## 2011-12-09 NOTE — Progress Notes (Signed)
    Daily Group Progress Note  Program: CD-IOP   Group Time: 1-2:30 pm  Participation Level: Active  Behavioral Response: Appropriate and Sharing  Type of Therapy: Psycho-education Group  Topic: Recovery 101: first part of group included a review of basic recovery concepts. The group had 3 new group members today and the session was, in part, a primer for recovery and also a review for current group members. There was good discussion among the members who recounted specific events or incidents where they struggled, were successful in fighting off cravings or, in some instances, succumbed to their addiction. The new group members engaged in the discussion and each one shared significantly about their own struggles.     Group Time: 2:45-4 pm  Participation Level: Active  Behavioral Response: Sharing  Type of Therapy: Process Group  Topic: Group process; second half of group was spent in process. Members discussed current issues and concerns in early recovery. One member noted she had picked up a 30 day chip yesterday and the group applauded this news. There was good disclosure and feedback among the group.    Summary: The patient was engaged and active in the session. She identified her tools as being [employer], working with a sponsor and phoning people. She emphasized the importance of doing these things everyday, but not everyone concurred with this belief. The patient shared that she had met with friends this weekend as well as people in recovery and kept busy. She reported her sponsor is much more hands on than previous sponsors, but she likes it and the accountability it requires of her. The patient reported it is strange not to be going to work and that this week represents the first full week of not having her full-time job at [employer]. The patient reported when she last relapsed , she had persuaded herself that she could just drink for one evening and, of course, it never  ended that way. The patient was very open and honest about her own addiction and life in recovery and she made some good comments.    Family Program: Family present? No   Name of family member(s):   UDS collected: No Results:   AA/NA attended?: YesMonday, Tuesday, Wednesday, Thursday, Friday, Saturday and Sunday  Sponsor?: Yes   [name] [name], LCAS

## 2011-12-10 ENCOUNTER — Other Ambulatory Visit (HOSPITAL_COMMUNITY): Payer: BC Managed Care – PPO | Admitting: Psychology

## 2011-12-10 DIAGNOSIS — F1920 Other psychoactive substance dependence, uncomplicated: Secondary | ICD-10-CM

## 2011-12-11 LAB — PRESCRIPTION ABUSE MONITORING 17P, URINE
Barbiturate Screen, Urine: NEGATIVE ng/mL
Cocaine Metabolites: NEGATIVE ng/mL
Creatinine, Urine: 84.82 mg/dL
Fentanyl, Ur: NEGATIVE ng/mL
MDMA URINE: NEGATIVE ng/mL
Meperidine, Ur: NEGATIVE ng/mL
Methadone Screen, Urine: NEGATIVE ng/mL
Oxycodone Screen, Ur: NEGATIVE ng/mL
Tramadol Scrn, Ur: NEGATIVE ng/mL
Zolpidem, Urine: NEGATIVE ng/mL

## 2011-12-11 NOTE — Progress Notes (Signed)
    Daily Group Progress Note  Program: CD-IOP   Group Time: 1-2:30 pm  Participation Level: Active  Behavioral Response: Appropriate  Type of Therapy: Psycho-education Group  Topic:  Honesty: first half of group spent in discussion about the essential element of 'honesty' in recovery. A new member led the discussion as he shared openly about his 'stuff' and the importance of being totally honest and 'transparent' if he is to stay alive. Other members shared from their experiences. The session was very powerful and the willingness of group members to disclose many of their ugly 'truths' was impressive and motivated other group members to look at themselves with such honesty.     Group Time: 2:45-4 pm  Participation Level: Active  Behavioral Response: Sharing  Type of Therapy: Process Group  Topic: Second half of group was spent in process. A review of the group rules, including no romantic involvement between group members was emphasized. Two members had been discharged today because they had become involved and this altered relationship had made it impossible for one or both of them to share openly about their struggles and issues. In addition, I explained how the unwillingness or inability to be completely open can contaminate their entire group. The basic message was one of accountability and the consequences of the choices we make.    Summary: The patient reported she is numb and doesn't feel anything. She reported she is experiencing a growing depression and admitted she didn't get up to run today and just laid in bed. When asked about this lack of motivation and energy, she reported this has happened before and she usually snaps out of it at some point. She admitted she feels 'nothing' and agreed that not having the office to go to is really devastating. The patient validated another members concerns and noted that there were many times when she hadn't been there for her  children - whether it was work or her drinking. The patient provided good feedback to her fellow group members, but her growing depression is a big concern. She reported she continues to take her medications and had visited with her private counselor yesterday.    Family Program: Family present? No   Name of family member(s):   UDS collected: Yes Results:Negative AA/NA attended?: YesMonday, Tuesday, Wednesday, Thursday, Friday, Saturday and Sunday  Sponsor?: Yes   Kaylene Dawn, LCAS

## 2011-12-12 ENCOUNTER — Other Ambulatory Visit (HOSPITAL_COMMUNITY): Payer: BC Managed Care – PPO | Admitting: Psychology

## 2011-12-15 ENCOUNTER — Other Ambulatory Visit (HOSPITAL_COMMUNITY): Payer: BC Managed Care – PPO | Admitting: Psychology
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
## 2011-12-16 NOTE — Progress Notes (Signed)
    Daily Group Progress Note  Program: CD-IOP   Group Time: 1-2:30 pm  Participation Level: Active  Behavioral Response: Appropriate and Sharing  Type of Therapy: Psycho-education Group  Topic: Controlling: letting go of control in early recovery. First half of group included a presentation on 'control' and the need to let go of efforts to control others. A handout was provided and the group read some of the examples together. A discussion followed about letting go and learning to trust one's own self.      Group Time: 2:45- 4 pm  Participation Level: Active  Behavioral Response: Sharing and Assertive  Type of Therapy: Process Group  Topic: Group Process/Graduation: second half of group was spent in process. A new member was in group today and he shared about his life and struggle with addiction. The group assisted him in processing his plans going forward and, at times, challenged him to reconsider his ideas. At the conclusion of the session, a graduation ceremony was held to honor a member successfully completing this program. There were kind words and wishes and hearty thanks from the graduating member.    Summary: The patient engaged actively in the discussion on 'control'. She admitted she didn't feel like she tried to control others, but noted that almost every time she relapsed, she had felt manipulated by someone else and had used it as an excuse to get drunk. The patient asked good questions of new group member and made some excellent suggestions based on her past experiences. She encouraged to consider moving away from his actively alcoholic parents.In the graduation, the patient shared her encouragement to the departing member and she responded well to this intervention.    Family Program: Family present? No   Name of family member(s):   UDS collected: No Results:   AA/NA attended?: YesMonday, Tuesday, Wednesday, Thursday, Friday, Saturday and Sunday  Sponsor?:  Yes   Kacen Mellinger, LCAS

## 2011-12-17 ENCOUNTER — Other Ambulatory Visit (HOSPITAL_COMMUNITY): Payer: BC Managed Care – PPO | Admitting: Psychology

## 2011-12-17 ENCOUNTER — Encounter (HOSPITAL_COMMUNITY): Payer: Self-pay | Admitting: Psychology

## 2011-12-17 DIAGNOSIS — F1920 Other psychoactive substance dependence, uncomplicated: Secondary | ICD-10-CM

## 2011-12-18 ENCOUNTER — Encounter (HOSPITAL_COMMUNITY): Payer: Self-pay | Admitting: Psychology

## 2011-12-18 LAB — PRESCRIPTION ABUSE MONITORING 17P, URINE
Amphetamine/Meth: NEGATIVE ng/mL
Cocaine Metabolites: NEGATIVE ng/mL
MDMA URINE: NEGATIVE ng/mL
Opiate Screen, Urine: NEGATIVE ng/mL
Oxycodone Screen, Ur: NEGATIVE ng/mL
Tramadol Scrn, Ur: NEGATIVE ng/mL
Zolpidem, Urine: NEGATIVE ng/mL

## 2011-12-18 NOTE — Progress Notes (Signed)
Patient ID: [name] [name], female   DOB: 1961-09-01, 51 y.o.   MRN: [id number] Phoned this patient's private therapist, Marliss Czar, to share my concerns about her well-being. I noted her report about growing depression yesterday after group. He confirmed that they had met on Tuesday and he also expressed concern about her being so demoralized over her recent job loss. I noted that she had said they might meet again today, but he reported his schedule was full so it was unlikely he would see her. We agreed to remain in touch should there be any more concerns or issues that should be relayed regarding this patient.   Later in the day, during treatment team, the patient's disclosure about depression was discussed. There were definite concerns about her well-being and I agreed to contact AM. The medical director questioned whether it was time to change her SSRI and noted there might be more effective medications than Prozac, which she has been taking for at least 7-8 years. Will meet with her tomorrow and inquire about her depression.

## 2011-12-18 NOTE — Progress Notes (Signed)
Patient ID: [name] [name], female   DOB: 04-Nov-1960, 51 y.o.   MRN: [id number] After group today the patient asked to speak with me briefly. She reported she is really depressed. She admitted she had not gotten out of bed until 10:30 this morning and had only run twice this week. She is usually up at 5 am and running everyday by 5:20. She reported she had met with her private therapist, [name] [name], yesterday and might even meet with him again tomorrow depending on how she feels. We discussed what she needed to do to remain connected and not isolate, as is her typical pattern. She reported she was in close contact with her closest friends and attending meetings daily. I wondered about her medications, but she noted that 300 mg of Wellbutrin is about as much as one can take. I encouraged her to contact me if she needed to talk, but also speak with her sponsor and close friends. She stated she would see me on Friday in group. Will follow closely and share this information with treatment team in weekly session tomorrow at 11 am.

## 2011-12-18 NOTE — Progress Notes (Signed)
    Daily Group Progress Note  Program: CD-IOP   Group Time: 1-2:30 pm  Participation Level: Active  Behavioral Response: Appropriate and Sharing  Type of Therapy: Psycho-education Group  Topic:Resentments: addressing your resentments in early recovery. A presentation was provided on resentments. Handouts were included and the group engaged in a discussion about how and why resentments develop. One member noted she had come to recognize that by keeping her 'resentment' it perpetuated her 'closeness' to the person or issue. This isnight proved monumental to her fellow members. There were disclosures of great depth and personal importance and each member shared in the session.       Group Time: 2:45-4 pm  Participation Level: Active  Behavioral Response: Sharing  Type of Therapy: Process Group  Topic: Group Process; second half of group was spent in process. Members shared about their issues and current concerns. Frustrations were vented and good feedback provided to group members.    Summary: The patient reported that resentments had led to numerous relapses over the years. She pointed out that she recognized that by holding onto a resentment it keeps the person or issue 'close'. This proved incredibly insightful to at least 2 group members who were suddenly surprised by their resentments and how they have not let them go. The patient reported she has the most problem with men in relationships and these dysfunctional relationships contribute to misunderstandings and the build up of relationships. She shared about her father and his unhealthy ways and the frustrations from her work. The patient made some excellent comments and provided good feedback to her fellow group members.    Family Program: Family present? No   Name of family member(s):   UDS collected: Yes Results:not available   AA/NA attended?: YesMonday, Tuesday, Wednesday, Thursday, Friday, Saturday and Sunday  Sponsor?: Yes   Maryellen Dowdle, LCAS

## 2011-12-19 ENCOUNTER — Other Ambulatory Visit (HOSPITAL_COMMUNITY): Payer: BC Managed Care – PPO | Attending: Physician Assistant

## 2011-12-19 DIAGNOSIS — F1020 Alcohol dependence, uncomplicated: Secondary | ICD-10-CM | POA: Insufficient documentation

## 2011-12-19 DIAGNOSIS — F1920 Other psychoactive substance dependence, uncomplicated: Secondary | ICD-10-CM | POA: Insufficient documentation

## 2011-12-19 DIAGNOSIS — R630 Anorexia: Secondary | ICD-10-CM | POA: Insufficient documentation

## 2011-12-22 ENCOUNTER — Other Ambulatory Visit (HOSPITAL_COMMUNITY): Payer: BC Managed Care – PPO | Admitting: Psychology

## 2011-12-22 DIAGNOSIS — F1920 Other psychoactive substance dependence, uncomplicated: Secondary | ICD-10-CM

## 2011-12-23 ENCOUNTER — Telehealth (HOSPITAL_COMMUNITY): Payer: Self-pay | Admitting: Psychology

## 2011-12-23 LAB — PRESCRIPTION ABUSE MONITORING 17P, URINE
Amphetamine/Meth: POSITIVE ng/mL — ABNORMAL HIGH
Buprenorphine, Urine: NEGATIVE ng/mL
Carisoprodol, Urine: NEGATIVE ng/mL
Opiate Screen, Urine: NEGATIVE ng/mL
Oxycodone Screen, Ur: NEGATIVE ng/mL
Propoxyphene: NEGATIVE ng/mL
Tramadol Scrn, Ur: NEGATIVE ng/mL

## 2011-12-23 LAB — ALCOHOL METABOLITE (ETG), URINE: Ethyl Glucuronide (EtG): NEGATIVE ng/mL

## 2011-12-23 NOTE — Progress Notes (Signed)
    Daily Group Progress Note  Program: CD-IOP   Group Time: 1-2:30 pm  Participation Level: Active  Behavioral Response: Appropriate  Type of Therapy: Psycho-education Group  Topic: Treatment for a BUD: first half of group spent in psycho-education. A presentation was provided on identifying when one is in a "BUD", or building up to drinking/drugging. There are identifiable changes that many go through prior to relapse and these changes in thinking, attitudes, and behaviors were identified and discussed. Members shared what they have done in the past to avoid relapse and the importance of 'talking to someone' was reiterated. The message from the session was that actually drinking/drugging is the last thing that happens in this process and there is time to disrupt the use.     Group Time: 2:45-4 pm  Participation Level: Active  Behavioral Response: Appropriate and Sharing  Type of Therapy: Process Group  Topic: Process: second half of group spent in process. Members shared their frustrations and issues in early recovery. A new member was present and he was invited by the group to share a little of what brings him to this program. He was very open about his struggles with Klonipin and the terrible withdrawal he had gone through. There was good disclosure among group members and excellent feedback and support to the newer group members.    Summary: The patient shared in the discussion on BUD and was able to identify a number of behaviors that signify something is changing for her. The biggest single one is 'isolating'. The patient admitted when she withdraws from social engagement, it is a bad sign. In process, she reported she has been very depressed. She has to make herself get up and do things. She reported she had run early this morning, but despite having a long list of chores to complete, she just laid back down. The patient reported she had made a collage over the weekend and it  had proven very revealing. She agreed to bring it to group on Wednesday. She made some excellent comments and provided good feedback to fellow group members.    Family Program: Family present? No   Name of family member(s):   UDS collected: Yes Results: Not available}  AA/NA attended?: YesMonday, Tuesday, Wednesday, Thursday, Friday, Saturday and Sunday  Sponsor?: Yes   Leva Baine, LCAS

## 2011-12-24 ENCOUNTER — Other Ambulatory Visit (HOSPITAL_COMMUNITY): Payer: BC Managed Care – PPO | Admitting: Psychology

## 2011-12-24 LAB — AMPHETAMINES (GC/LC/MS), URINE
Amphetamine GC/MS Conf: NEGATIVE NG/ML
MDA GC/MS confirm: NEGATIVE NG/ML
MDMA GC/MS Conf: NEGATIVE NG/ML
Methamphetamine Quant, Ur: NEGATIVE NG/ML
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
## 2011-12-26 ENCOUNTER — Other Ambulatory Visit (HOSPITAL_COMMUNITY): Payer: BC Managed Care – PPO | Admitting: *Deleted

## 2011-12-27 ENCOUNTER — Other Ambulatory Visit (HOSPITAL_COMMUNITY): Payer: Self-pay | Admitting: Physician Assistant

## 2011-12-29 ENCOUNTER — Other Ambulatory Visit (HOSPITAL_COMMUNITY): Payer: BC Managed Care – PPO | Admitting: Psychology

## 2011-12-29 NOTE — Progress Notes (Signed)
    Daily Group Progress Note  Program: CD-IOP   Group Time: 1:00-2:15  Participation Level: Active  Behavioral Response: Appropriate and Sharing  Type of Therapy: Group Therapy  Topic: Group therapy time was spent in process and discussion. Pts shared their experiences in 12-Step meetings and encouraged each other in the recovery process. [name] [name] shared at length about her own struggles recently with depression and recovery. Was very open and vulnerable with the group and the group responded well to her.      Group Time: 2:15-3:30  Participation Level: Active  Behavioral Response: Appropriate, Sharing and Motivated  Type of Therapy: Education and Training Group  Topic: Relapse Prevention- Decisional Balance Sheet   Summary: Pts completed two handouts about recovery and relapse prevention. They shared about their motivations to maintain abstinence from AOD and also about consequences of using and not using. [name] [name] was especially open about her incentives and struggles to stay alcohol and drug free.   Family Program: Family present? No   Name of family member(s):0  UDS collected: No Results: negative  AA/NA attended?: Yes  Sponsor?: Yes   Parke Simmers, Sonny Dandy, COUNS

## 2011-12-30 NOTE — Progress Notes (Signed)
    Daily Group Progress Note  Program: CD-IOP   Group Time: 1-2:30 pm  Participation Level: Active  Behavioral Response: Appropriate and Sharing  Type of Therapy: Psycho-education Group  Topic: Chaplain and Spirituality: First half of group included the monthly session with a Chaplain in the [employer]. Theda Belfast, the director of [employer], appeared today. He discussed the meaning of spirituality versus religion and invited group members to provide their definition of spirituality. The conversation turned to grief and loss and how we find meaning through grief and learn to mourn our losses fully. The chaplain encouraged members to be aware of where and when they felt most grounded or centered and to seek out and explore those areas more. There was good feedback and discussion among the group members.   Group Time: 2:45-4pm  Participation Level: Active  Behavioral Response: Sharing  Type of Therapy: Process Group  Topic: Group Process; second half of group spent in process. Members shared some of their current concerns. A new member was present today and he was asked to share the circumstances of his presence here in the CD-IOP. The new member proceeded to provide a classic case of denial, minimization and deflection. The group was firm about this and pointed out the discrepancies in his story. The patient was able to share openly about his narcotic pain addiction and admitted he belonged in the group.   Summary: The patient shared openly about her sense of spirituality. 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 She reported she is still struggling with depression, but managed to get up and get some things done this morning and she received a number of compliments about her dress today. In process she shared that her sponsor works her really hard and she  completes workbooks and journals and is busy reading everyday. This was in answer to another member who complained about the meetings and degree of effort required of her new sponsor. The patient also challenged the new group member about his denial. She made some excellent comments.    Family Program: Family present? No   Name of family member(s):   UDS collected: No Results:   AA/NA attended?: YesMonday, Tuesday, Wednesday, Thursday, Friday, Saturday and Sunday  Sponsor?: Yes   Ariel Dimitri, LCAS

## 2011-12-31 ENCOUNTER — Other Ambulatory Visit (HOSPITAL_COMMUNITY): Payer: BC Managed Care – PPO | Admitting: Psychology

## 2011-12-31 DIAGNOSIS — F1920 Other psychoactive substance dependence, uncomplicated: Secondary | ICD-10-CM

## 2012-01-01 LAB — PRESCRIPTION ABUSE MONITORING 17P, URINE
Amphetamine/Meth: NEGATIVE ng/mL
Barbiturate Screen, Urine: NEGATIVE ng/mL
Cocaine Metabolites: NEGATIVE ng/mL
Creatinine, Urine: 30.31 mg/dL
Meperidine, Ur: NEGATIVE ng/mL
Opiate Screen, Urine: NEGATIVE ng/mL
Oxycodone Screen, Ur: NEGATIVE ng/mL
Tapentadol, urine: NEGATIVE ng/mL
Zolpidem, Urine: NEGATIVE ng/mL

## 2012-01-01 LAB — ALCOHOL METABOLITE (ETG), URINE: Ethyl Glucuronide (EtG): NEGATIVE ng/mL

## 2012-01-01 NOTE — Progress Notes (Signed)
    Daily Group Progress Note  Program: CD-IOP   Group Time: 1-2:30 pm  Participation Level: Active  Behavioral Response: Appropriate  Type of Therapy: Psycho-education Group  Topic: Regaining Trust: First part of group spent in psycho- education concerning the issue of trust. A handout was provided that included identifying ways that the recovering person can regain trust as well as things that the family can do to rebuild trust. The handout generated a lively discussion on the topic of trust and the struggles that group members have experienced both in their addiction and now in recovery.   Group Time: 2:45-4 pm  Participation Level: Active  Behavioral Response: Sharing  Type of Therapy: Process Group  Topic: Group Process; second half of group spent in process. Members discussed current issues they are dealing with. There were 2 new group members present and they introduced themselves. One of the new members seemed to minimize his alcohol use and the group had numerous questions for him. There was good disclosure and feedback among the group.   Summary: The patient arrived a little late for group and apologized. Another member asked how she was feeling and noted that she looked brighter. The patient admitted she was feeling much better and reported she had run this morning and gotten a lot done today. She explained how she had some insight during an AA meeting last night and had gained some peace since then. The patient shared about how she had lied and snuck around with her drinking and provided examples with her children. She admitted that is took time and doing the right thing to regain trust. In process, the patient noted that she had been very upset during the visit on Monday with the chaplain. His comments about grief had proven very disturbing to her. She talked about this with her therapist yesterday and realized that she hasn't grieved her grandfather's passing when she was 61  yo or any other loss for that matter. The patient provided good feedback to her fellow group members and responded well to this intervention.    Family Program: Family present? No   Name of family member(s):   UDS collected: Yes Results: negative  AA/NA attended?: YesMonday, Tuesday, Wednesday, Thursday, Friday, Saturday and Sunday  Sponsor?: Yes   Hazael Olveda, LCAS

## 2012-01-02 ENCOUNTER — Other Ambulatory Visit (HOSPITAL_COMMUNITY): Payer: BC Managed Care – PPO | Admitting: Psychology

## 2012-01-05 ENCOUNTER — Other Ambulatory Visit (HOSPITAL_COMMUNITY): Payer: BC Managed Care – PPO | Admitting: Psychology

## 2012-01-05 ENCOUNTER — Other Ambulatory Visit (HOSPITAL_COMMUNITY): Payer: Self-pay | Admitting: Physician Assistant
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
## 2012-01-06 NOTE — Progress Notes (Signed)
    Daily Group Progress Note  Program: CD-IOP   Group Time: 1-2:30 pm  Participation Level: Active  Behavioral Response: Appropriate  Type of Therapy: Psycho-education Group  Topic: Self-Care in Early Recovery; Presentation on the importance of self-care in early recovery was provided. Included in this presentation was a handout that consisted of a check-list worksheet. Members were asked to rate the frequency of many different areas of their life. Members identified different elements of self-care and those were recorded on the board. There was a wide variety of responses to questions on self-care and it was clear that most group member's stability in early recovery would benefit from an increased emphasis on their self-care.  Group Time: 2:45-4 pm  Participation Level: Active  Behavioral Response: Sharing  Type of Therapy: Process Group  Topic: Process; second half of group spent in process. Members shared some of their current struggles and challenges. Members who had not shared in the first part of group were asked to check-in. A PA from [facility] who is completing her rotation was also present in group today and she provided good insight and information relevant to the group discussion.   Summary: The patient was attentive and engaged actively in the session. She reported if she had been in a program like this one a few years ago, she believed she wouldn't have relapsed and been here today. She emphasized the importance of talking and sharing openly about her own struggles. The patient was unable to identify any significant or obvious red flags prior to relapse. As she shared about her most recent use on New Year's Eve, she noted she had been working nonstop since [employer]. The group was reminded of this and in reviewing her work schedule, the patient admitted she worked over 70 hours a week. I pointed out the lack of balance this represented in the patient's life and the obvious  negative impact it held for her in the long run. When encouraged to sit quietly for 10 minutes per day, with no stimulation whatsoever, the patient seemed shocked and admitted she probably couldn't do that. There was good disclosure and honesty put forth by this patient, yet her inability to acknowledge a  lack of balance (she had only had a banana to eat today) in her life is very troubling. She responded well to this intervention.  Family Program: Family present? No   Name of family member(s):   UDS collected: No Results:   AA/NA attended?: YesMonday, Tuesday, Wednesday, Thursday, Friday, Saturday and Sunday  Sponsor?: Yes   Shray Hunley, LCAS

## 2012-01-07 ENCOUNTER — Other Ambulatory Visit (HOSPITAL_COMMUNITY): Payer: BC Managed Care – PPO | Admitting: Psychology

## 2012-01-07 DIAGNOSIS — F1920 Other psychoactive substance dependence, uncomplicated: Secondary | ICD-10-CM

## 2012-01-08 LAB — PRESCRIPTION ABUSE MONITORING 17P, URINE
Creatinine, Urine: 39.23 mg/dL
MDMA URINE: NEGATIVE ng/mL
Meperidine, Ur: NEGATIVE ng/mL
Methadone Screen, Urine: NEGATIVE ng/mL
Opiate Screen, Urine: NEGATIVE ng/mL
Oxycodone Screen, Ur: NEGATIVE ng/mL
Propoxyphene: NEGATIVE ng/mL
Zolpidem, Urine: NEGATIVE ng/mL

## 2012-01-08 NOTE — Progress Notes (Signed)
    Daily Group Progress Note  Program: CD-IOP   Group Time: 1-2:30 pm  Participation Level: Active  Behavioral Response: Appropriate  Type of Therapy: Psycho-education Group  Topic:Triggers and Cravings: A PowerPoint presentation was provided on triggers and cravings. Information presented included clarification of the Limbic system and the power of conditioning in addiction. The process or development of an addiction was identified with emphasis on the growth of triggers prior to actual drugging or drinking. The presentation invited group disclosure and discussion and members shared about some of their own experiences along the process towards addiction.    Group Time: 2:45- 4pm  Participation Level: Active  Behavioral Response: Appropriate and Sharing  Type of Therapy: Process Group  Topic: Process: second half of group was spent in process. Included was a 10 minute guided imagery reading. Group members shared about their current struggles and concerns and there was good sharing among members.   Summary: The patient was engaged and shared openly about her alcohol use and the development of her addiction. She noted she was already far along down the path in college. She also pointed out that she was focused on only 3 things during that time: school, dance, and drinking. In process, the patient had brought the guided imagery to offer for the group and I read it. The patient reported she was very fortunate to have been in this program when she lost her job and how helpful it had been to her. She made some excellent comments and provided good feedback to her fellow group members.    Family Program: Family present? No   Name of family member(s):   UDS collected: No Results:   AA/NA attended?: YesMonday, Tuesday, Wednesday, Thursday, Friday, Saturday and Sunday  Sponsor?: Yes   Dalisha Shively, LCAS

## 2012-01-09 ENCOUNTER — Other Ambulatory Visit (HOSPITAL_COMMUNITY): Payer: BC Managed Care – PPO | Admitting: Psychology

## 2012-01-12 ENCOUNTER — Other Ambulatory Visit (HOSPITAL_COMMUNITY): Payer: BC Managed Care – PPO | Admitting: Psychology

## 2012-01-12 DIAGNOSIS — F1920 Other psychoactive substance dependence, uncomplicated: Secondary | ICD-10-CM

## 2012-01-12 NOTE — Progress Notes (Signed)
    Daily Group Progress Note  Program: CD-IOP   Group Time: 1-2:30 pm  Participation Level: Active  Behavioral Response: Appropriate  Type of Therapy: Activity Group  Topic: Yoga: first half of group spent in a yoga session. A certified yoga instructor, Jari Favre, was here today to lead the session. Members responded well to the "[facility]" and the session went very well.  Group Time: 2:45- 4pm  Participation Level: Active  Behavioral Response: Sharing  Type of Therapy: Process Group  Topic: Group Process: second half of group spent in process. Members talked about their current concerns and issues. At one point, members challenged another member after he reported having attended at least 4 12-step meetings. Upon further review, it was quite clear that he had not attended any meetings. There was good feedback and disclosure among the group.  Summary: The patient was engaged and enjoyed the yoga class. In process, she also challenged the member about the meetings he had attended. She was very open about her own disease and discussed her struggles with alcoholism. Over the weekend, the patient reported is is going to attend a training on '[employer] Writing'. She displayed more motivation and enthusiasm for her life then she has recently an appears to be coming out of the 'funk' of depression and lethargy that had enveloped her. She made some excellent comments.    Family Program: Family present? No   Name of family member(s):   UDS collected: No Results:   AA/NA attended?: YesMonday, Tuesday, Wednesday, Thursday, Friday, Saturday and Sunday  Sponsor?: Yes   Shaquanta Harkless, LCAS

## 2012-01-13 LAB — PRESCRIPTION ABUSE MONITORING 17P, URINE
Amphetamine/Meth: NEGATIVE ng/mL
Barbiturate Screen, Urine: NEGATIVE ng/mL
Fentanyl, Ur: NEGATIVE ng/mL
MDMA URINE: NEGATIVE ng/mL
Oxycodone Screen, Ur: NEGATIVE ng/mL
Propoxyphene: NEGATIVE ng/mL
Zolpidem, Urine: NEGATIVE ng/mL

## 2012-01-14 ENCOUNTER — Other Ambulatory Visit (HOSPITAL_COMMUNITY): Payer: BC Managed Care – PPO | Admitting: Psychology

## 2012-01-15 NOTE — Progress Notes (Signed)
Patient ID: [name] [name], female   DOB: 11-07-1960, 51 y.o.   MRN: [id number] Treatment Plan Review: met with the patient in the morning before her CD-IOP group session. I explained the need to discuss her progress and goals for treatment. She has identified sobriety and building her support network as her 2 treatment goals. Upon review, the patient is making excellent progress. She remains sober and is very engaged in the 12-step community attending 1-2 meetings per day. She also has a new sponsor and is working on the steps. She admittedly continues to struggle with depression and some days are worse than others. I mentioned, again, my concerns about her eating disorder and the very small number of calories she ingests daily - around 700- 800 per day - despite a very active and busy schedule. The patient downplayed her dietary intake and assured me that she is getting all that she needs. She continues to meet with her therapist, Marliss Czar, who works with Dr. Jennelle Human and reported she remains compliant with all medications. All documentation was reviewed and the update successfully completed.

## 2012-01-15 NOTE — Progress Notes (Signed)
    Daily Group Progress Note  Program: CD-IOP   Group Time: 1-2:30 pm  Participation Level: Active  Behavioral Response: Appropriate  Type of Therapy: Psycho-education Group  Topic: Denial: a presentation was provided on the reasons or purposes of denial. These include the positive benefit of allowing one to consider or ponder on a crisis as well as the negative or destructive aspects of denial, especially as seen in addiction. The negative consequences of denial, especially the avoidance of problems and issues that aren't going away was discussed. Group members shared their own stories about denial in their lives and the times when they were in denial about their addiction. There was good disclosure among group members and the issue of denial examined closely.  Group Time: 2:45- 4pm  Participation Level: Active  Behavioral Response: Sharing  Type of Therapy: Process Group  Topic: Process; the second half of group was spent in process. Members discussed current issues and concerns. There were 2 new group members and they were asked to share about what has brought them to the group? One member was suffering from withdrawal and the group encouraged her to enter detox and shared their own experiences in a medically-monitored detox setting.  Summary:The patient shared openly about her own experiences with denial. She admitted that she knew she was an alcoholic at a very young age, but just didn't seem to care. She pointed out that sometimes, she has mistakenly thought that she could handle just "one drunk" and ended up days later in the hospital. She explained that this was an example of denial that has periodically surfaced in her life. This patient reported she had been in a medical detox and tried to detox on her own and admitted that it had been a terrible experience when she tried to detox at her home. The patient made some excellent comments and has remained alcohol-free since entering  this program.   Family Program: Family present? No   Name of family member(s):   UDS collected: Yes Results:Negative  AA/NA attended?: YesMonday, Tuesday, Wednesday, Thursday, Friday, Saturday and Sunday  Sponsor?: Yes   Monifa Blanchette, LCAS

## 2012-01-16 ENCOUNTER — Other Ambulatory Visit (HOSPITAL_COMMUNITY): Payer: BC Managed Care – PPO

## 2012-01-17 NOTE — Progress Notes (Signed)
    Daily Group Progress Note  Program: CD-IOP   Group Time: 1-2:30 pm  Participation Level: Active  Behavioral Response: Appropriate  Type of Therapy: Psycho-education Group  Topic: Self-Esteem: a presentaton was provided on improving and strengthening one's self-esteem. The damage that addiction causes to one's sense of self was discussed and members agreed that the guilt and shame they have felt for their actions have made them feel very badly about themselves. A list of things one can do to begin to build esteem was discussed with members writing down their repsonses to each of suggestion offered. The ability to identify one's strengths or positive attributes differed among the group. Some of the members with the lowest self-esteem seemed unable to identify their good qualities while others could list many positive attributes.  Group Time: 2:45- 4pm  Participation Level: Active  Behavioral Response: Appropriate and Sharing  Type of Therapy: Process Group  Topic: Group Process: the second half of group was spent in process. Members discussed their current issues and struggles. The group identified their plans for the upcoming Easter holiday and those plans that support and validate an abstinence-based lifestyle.  Summary: The patient shared the list of her positive attributes. I pointed out they were all "business-related" qualities with no feelings of the heart. She admitted she wasn't sure she had any. The group quickly offered their opinions of the strengths and positive qualities she displays, but she was unwilling to accept these accolades. Among group members, this patient clearly has the most self-contempt and loathing. She reported she would be spending the weekend attending numerous meetings and talking with others in recovery. She remains abstinent and is approaching a successful graduation from this program next week.  Family Program: Family present? No   Name of family  member(s):   UDS collected: No Results:  AA/NA attended?: YesMonday, Tuesday, Wednesday, Thursday, Friday, Saturday and Sunday  Sponsor?: Yes   Kaz Auld, LCAS

## 2012-01-19 ENCOUNTER — Other Ambulatory Visit (HOSPITAL_COMMUNITY): Payer: BC Managed Care – PPO | Attending: Physician Assistant | Admitting: Psychology

## 2012-01-19 DIAGNOSIS — F1020 Alcohol dependence, uncomplicated: Secondary | ICD-10-CM | POA: Insufficient documentation

## 2012-01-19 DIAGNOSIS — R630 Anorexia: Secondary | ICD-10-CM | POA: Insufficient documentation

## 2012-01-19 DIAGNOSIS — F1920 Other psychoactive substance dependence, uncomplicated: Secondary | ICD-10-CM | POA: Insufficient documentation

## 2012-01-20 NOTE — Progress Notes (Signed)
    Daily Group Progress Note  Program: CD-IOP   Group Time: 1-2:30 pm  Participation Level: Active  Behavioral Response: Appropriate  Type of Therapy: Process Group  Topic: Group Process: First half of group spent in process. Members shared about the holiday weekend and their experiences in early sobriety. One member had returned after having to go upstairs into detox last week. She received good support and validation and admitted she felt much better. There was a new group member present and she introduced herself and shared about her struggles with alcohol. There was good disclosure and feedback among the group.  Group Time: 2:45- 4 pm  Participation Level: Active  Behavioral Response: Sharing  Type of Therapy: Psycho-education Group  Topic: Alcohol and its Effects: Second half of group included a PowerPoint presentation on the effects of alcohol on one's body. There was a detailed explanation of how the body metabolizes alcohol and the effects of excessive alcohol on the various parts of the body, including liver, heart and brain. Group members seemed stunned to hear about some of these damaging and irreversible effects of alcohol. A good discussion accompanied the presentation.  Summary: The patient reported she had been busy for much of the holiday weekend. She noted she had gone to brunch yesterday and couldn't help but notice the table next to her ordering and sharing tastes of their Bloody Mary's. The patient laughed and admitted that she and her ex-husband used to make brunch and drink on the weekends. She reported her daughter had left for spring break with her father and she hoped she would get some rest. The patient shared about her high tolerance during the presentation on alcohol. She seemed a bit shocked to hear that blackouts are the result of oxygen deprivation to the brain and considered that perhaps that her struggles with memory issues could be related to the damage  she has done to hear brain. The patient made some excellent comments and shared openly in the session.   Family Program: Family present? No   Name of family member(s):   UDS collected: No Results:  AA/NA attended?: YesMonday, Tuesday, Wednesday, Thursday, Friday, Saturday and Sunday  Sponsor?: Yes   Deanthony Maull, LCAS

## 2012-01-21 ENCOUNTER — Other Ambulatory Visit (HOSPITAL_COMMUNITY): Payer: BC Managed Care – PPO

## 2012-01-21 ENCOUNTER — Encounter (HOSPITAL_COMMUNITY): Payer: Self-pay | Admitting: Psychology

## 2012-01-22 NOTE — Progress Notes (Signed)
Patient ID: [name] [name], female   DOB: 08-08-1961, 51 y.o.   MRN: [id number] Discharge Plan: I met with the patient this morning to review her discharge plan. The patient successfully completed the CD-IOP on Monday, the 1rst of April. Today represents our final session. The patient will be returning to work with her private therapist, Marliss Czar, as well as her psychiatrist, Dr. Meredith Staggers, at [facility]. She has worked with them for almost 5 years. She intends to increase her attendance at daily AA meetings and continued working closely with her sponsor. The patient is just beginning her 4th step. I expressed our concerns about the patient's eating disorder and her refusal to take her anorexia seriously. I also noted that the fact she has admitted to not taking her Campral as prescribed is also a red flag and area of great concern. Finally, her incredible self-disparagement is alarming. I urged her to meet with a dietician and discuss a caloric intake that would be appropriate for her age and energy level. The patient is a very active 51 yo woman who runs 3 miles at least 5 days per week. The day before she had hiked at [facility] before group, but had only eaten a banana as of 3 pm. The patient nodded, but her intentions to explore these issues is questionable at best. All discharge documentation was reviewed and signed accordingly. The patient had excellent attendance, proved herself to be a wonderful group member, and has remained sober since January 17th. (Please see discharge documentation from 01/21/2012).

## 2012-01-23 ENCOUNTER — Other Ambulatory Visit (HOSPITAL_COMMUNITY): Payer: BC Managed Care – PPO | Admitting: Psychology

## 2012-01-27 NOTE — Progress Notes (Signed)
    Daily Group Progress Note  Program: CD-IOP   Group Time: 1-2:30 pm  Participation Level: Active  Behavioral Response: Appropriate  Type of Therapy: Psycho-education Group  Topic: The Stimulant Drugs: a PowerPoint presentation was provided on the category of drugs known as stimulants. The focus was on cocaine and methamphetamine. Included in the session was a detailed description of the characteristics or symptoms of these drugs and the physiological and psychological changes they cause. There was a lively discussion among the group during the presentation as a number of members had been very heavy stimulant users, including cocaine and adder all. It proved to be a very informative session.  Group Time: 2:45- 4pm  Participation Level: Active  Behavioral Response: Appropriate and Sharing  Type of Therapy: Process Group  Topic:Process and Graduation: second half of group was spent in process. Members shared some of their current struggles and concerns. As the session neared the end, a graduation ceremony was held honoring 2 members who were successfully completing treatment today. There were kind words and a few tears and members said their good-byes and well wishes.   Summary: The patient was very attentive and asked a number of questions during the presentation. She recognized some of the symptoms of stimulant intoxication and questioned about whether a fellow she had been on a trip with had been high on some sort of stimulant? This was the patient's last group session and she was completing the program successfully at the conclusion of today's session. There were many kind and hopeful words offered to this graduating member. This patient, in turn, admitted that she had thought she was lost in her disease and had believed she would die from alcoholism. She stated how helpful and supportive this program had been and how she now had hope that she could remain sober and live a full and  fulfilling life. She has been an excellent group member and extremely honest about her own foibles with addiction.   Family Program: Family present? No   Name of family member(s):   UDS collected: No Results:   AA/NA attended?: YesMonday, Tuesday, Wednesday, Thursday, Friday, Saturday and Sunday  Sponsor?: Yes   Olubunmi Rothenberger, LCAS

## 2012-02-10 ENCOUNTER — Other Ambulatory Visit: Payer: Self-pay | Admitting: Obstetrics and Gynecology

## 2012-02-10 DIAGNOSIS — N63 Unspecified lump in unspecified breast: Secondary | ICD-10-CM

## 2012-02-12 ENCOUNTER — Ambulatory Visit
Admission: RE | Admit: 2012-02-12 | Discharge: 2012-02-12 | Disposition: A | Discharge status: Home / Self Care | Payer: BC Managed Care – PPO | Source: Ambulatory Visit | Attending: Obstetrics and Gynecology | Admitting: Obstetrics and Gynecology

## 2012-02-12 ENCOUNTER — Other Ambulatory Visit: Payer: Self-pay | Admitting: Obstetrics and Gynecology

## 2012-02-12 DIAGNOSIS — N63 Unspecified lump in unspecified breast: Secondary | ICD-10-CM

## 2012-02-19 ENCOUNTER — Ambulatory Visit
Admission: RE | Admit: 2012-02-19 | Discharge: 2012-02-19 | Disposition: A | Discharge status: Home / Self Care | Payer: BC Managed Care – PPO | Source: Ambulatory Visit | Attending: Obstetrics and Gynecology | Admitting: Obstetrics and Gynecology

## 2012-02-19 DIAGNOSIS — N63 Unspecified lump in unspecified breast: Secondary | ICD-10-CM

## 2012-12-12 IMAGING — CR DG LUMBAR SPINE COMPLETE 4+V
6 series · 6 of 6 positions shown · non-contrast
Comparison: None.

CLINICAL DATA: Right-sided sciatica.  Sciatic pain.  Low back pain.

LUMBAR SPINE - COMPLETE 4+ VIEW

[view not recorded (1 of 6)]
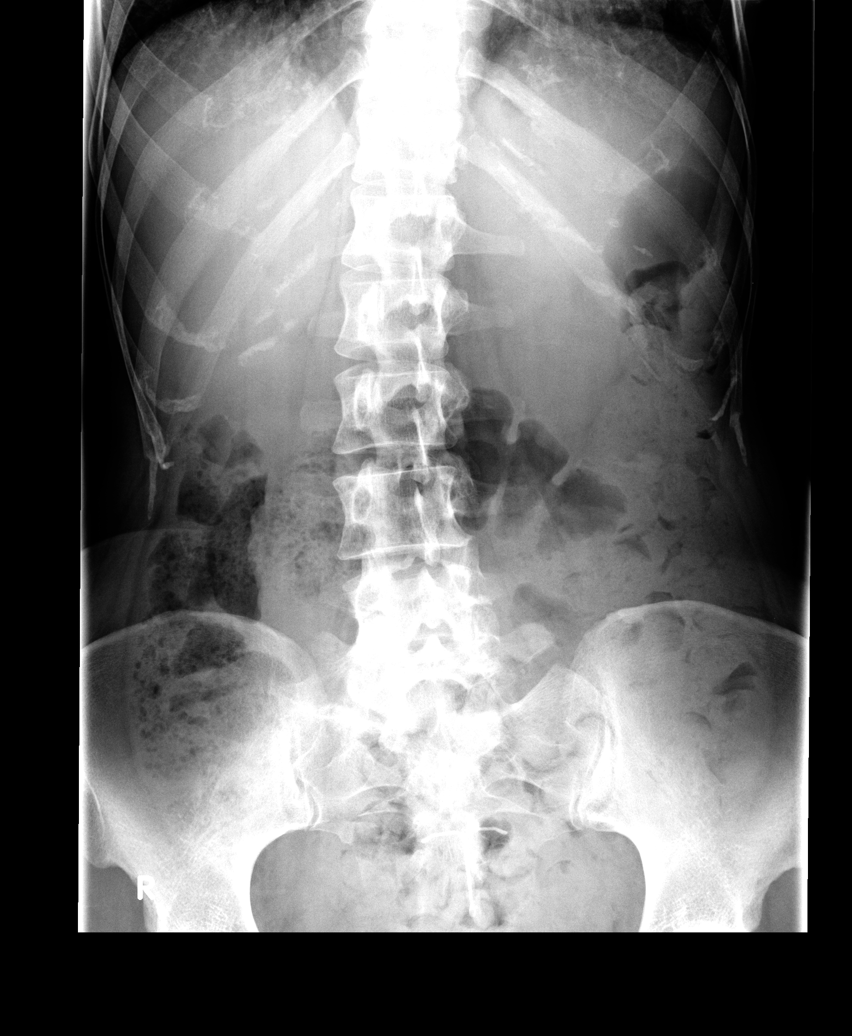

[view not recorded (2 of 6)]
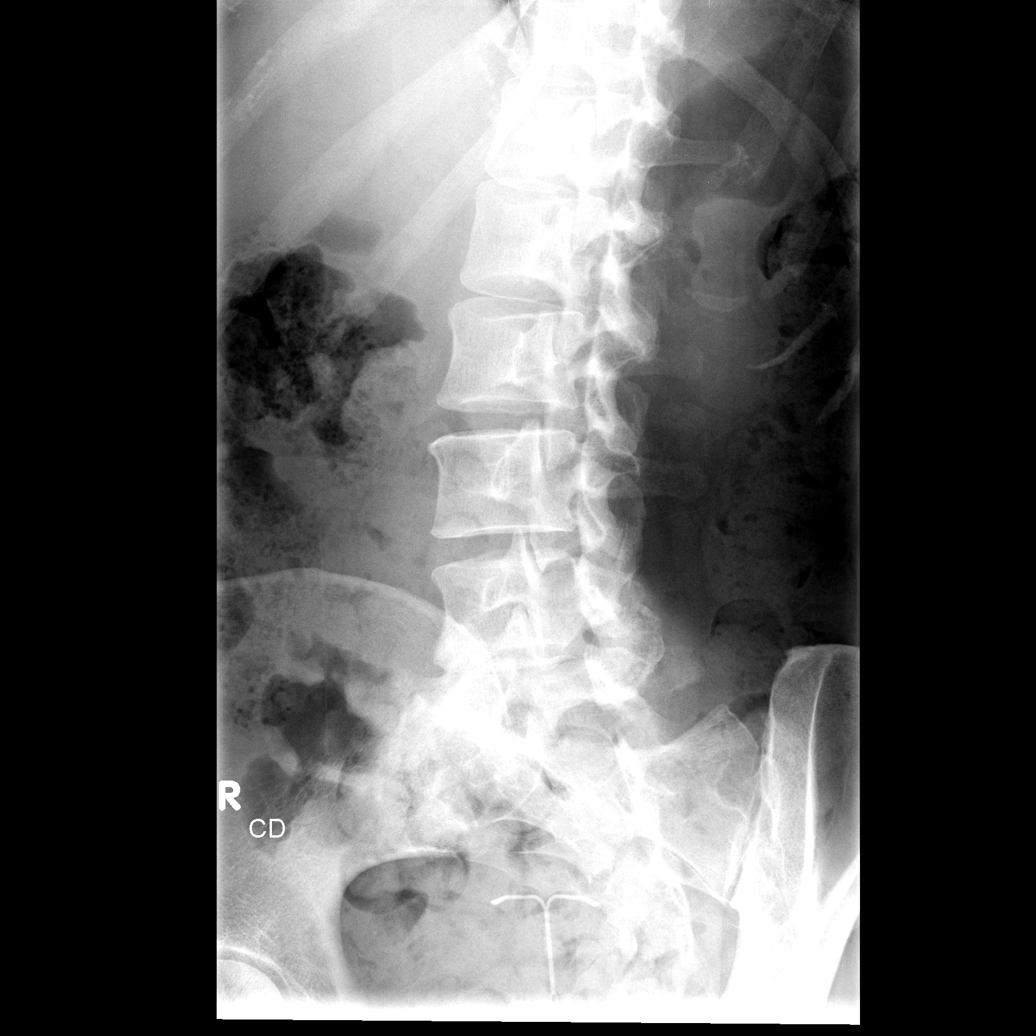

[view not recorded (3 of 6)]
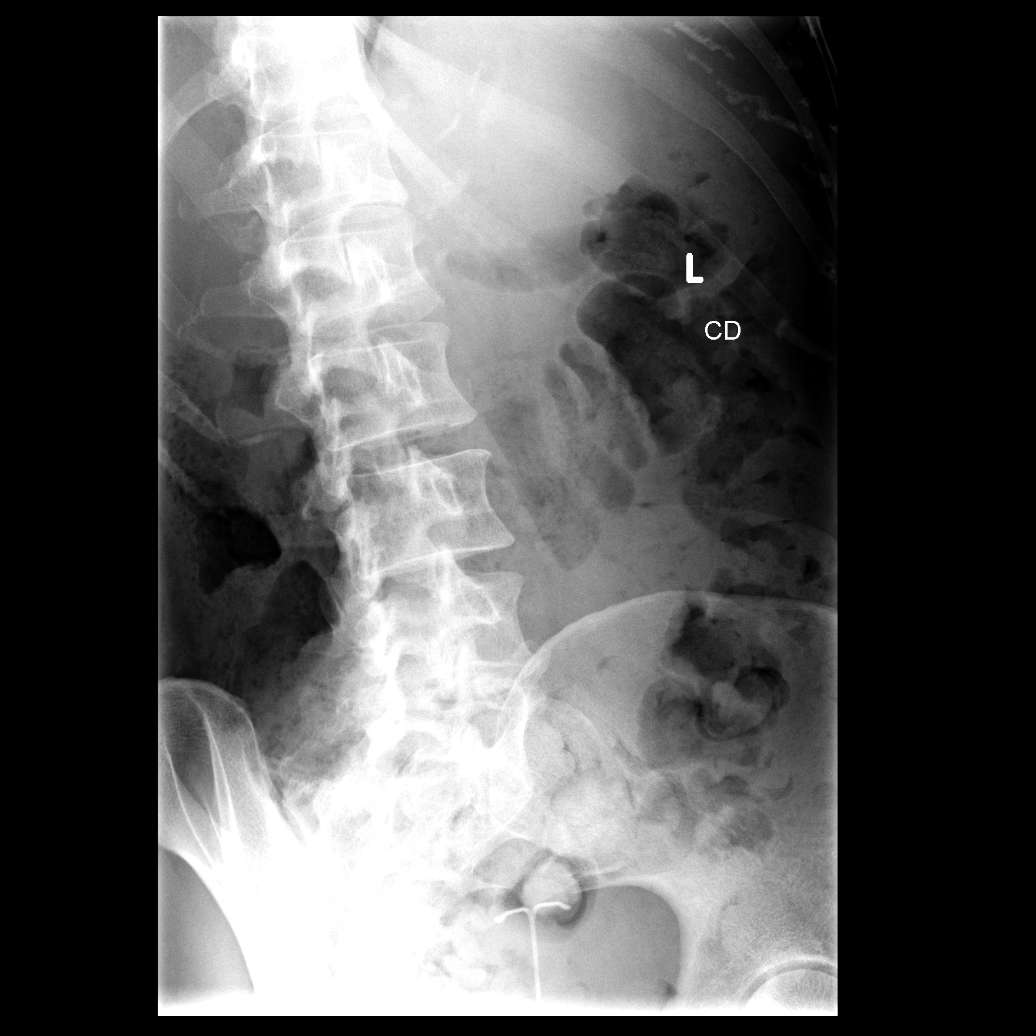

[view not recorded (4 of 6)]
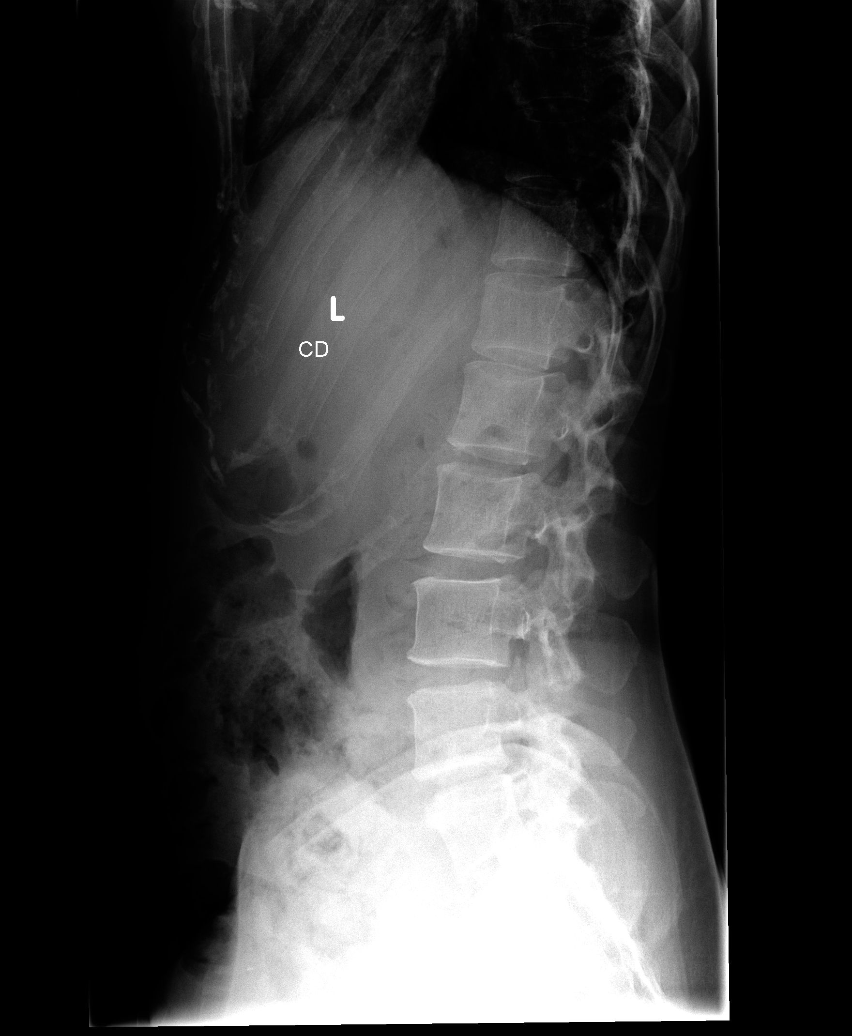

[view not recorded (5 of 6)]
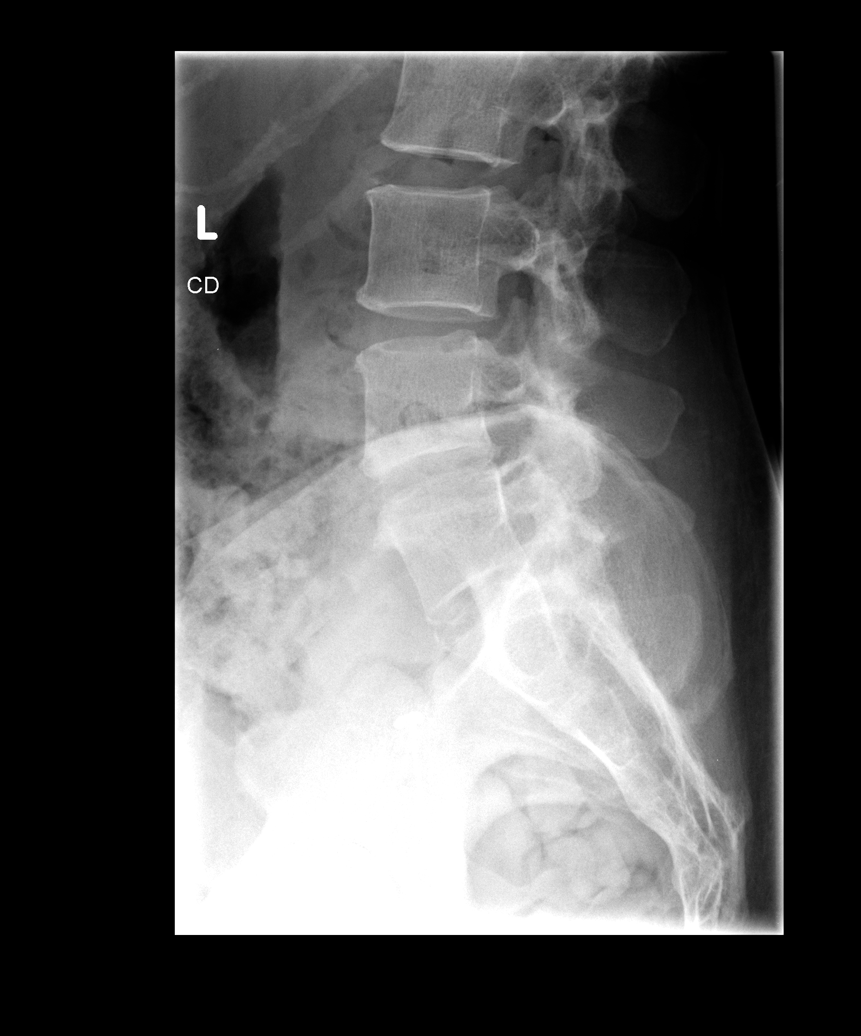

[view not recorded (6 of 6)]
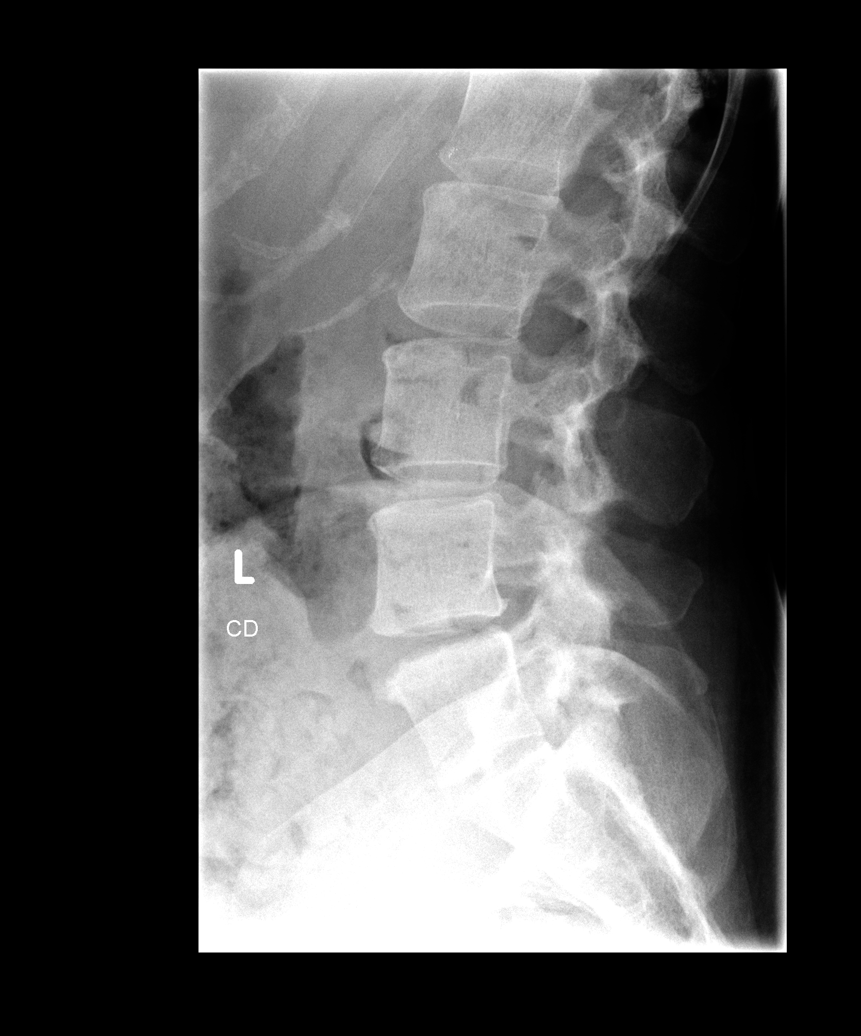

[6 of 6 positions shown; findings below may reference images not displayed]

FINDINGS: Dextroconvex lumbar scoliosis is present.  There are no
pars defects identified radiographically.  Lumbosacral transitional
anatomy is present with lumbarized S1 segment. Mild multilevel
lumbar degenerative disc disease is present.  The degenerative
disease is most pronounced at L5-S1.  Small endplate osteophytes
are present.  L5-S1 facet arthrosis is also present.  IUD is
incidentally noted in the anatomic pelvis.  The vertebral body
height is preserved
IMPRESSION: 1.  Lumbosacral transitional anatomy with lumbarized S1 segment.
2.  Dextroconvex lumbar scoliosis is mild, with the apex at L3.
3.  Mild lumbar spondylosis most pronounced at L5-S1.  L5-S1 facet
arthrosis.

## 2012-12-18 ENCOUNTER — Ambulatory Visit (INDEPENDENT_AMBULATORY_CARE_PROVIDER_SITE_OTHER): Payer: BC Managed Care – PPO | Admitting: Emergency Medicine

## 2012-12-18 VITALS — BP 89/50 | HR 70 | Temp 97.6°F | Resp 17 | Ht 66.5 in | Wt 103.0 lb

## 2012-12-18 DIAGNOSIS — N760 Acute vaginitis: Secondary | ICD-10-CM

## 2012-12-18 DIAGNOSIS — B9689 Other specified bacterial agents as the cause of diseases classified elsewhere: Secondary | ICD-10-CM

## 2012-12-18 DIAGNOSIS — A499 Bacterial infection, unspecified: Secondary | ICD-10-CM

## 2012-12-18 DIAGNOSIS — R300 Dysuria: Secondary | ICD-10-CM

## 2012-12-18 LAB — POCT URINALYSIS DIPSTICK
Bilirubin, UA: NEGATIVE
pH, UA: 8

## 2012-12-18 LAB — POCT WET PREP WITH KOH: Clue Cells Wet Prep HPF POC: 100

## 2012-12-18 LAB — POCT UA - MICROSCOPIC ONLY: Crystals, Ur, HPF, POC: NEGATIVE

## 2012-12-18 MED ORDER — METRONIDAZOLE 500 MG PO TABS: 500.0000 mg | ORAL_TABLET | Freq: Two times a day (BID) | ORAL | Status: DC

## 2012-12-18 NOTE — Patient Instructions (Signed)
Bacterial Vaginosis Bacterial vaginosis (BV) is a vaginal infection where the normal balance of bacteria in the vagina is disrupted. The normal balance is then replaced by an overgrowth of certain bacteria. There are several different kinds of bacteria that can cause BV. BV is the most common vaginal infection in women of childbearing age. CAUSES   The cause of BV is not fully understood. BV develops when there is an increase or imbalance of harmful bacteria.  Some activities or behaviors can upset the normal balance of bacteria in the vagina and put women at increased risk including:  Having a new sex partner or multiple sex partners.  Douching.  Using an intrauterine device (IUD) for contraception.  It is not clear what role sexual activity plays in the development of BV. However, women that have never had sexual intercourse are rarely infected with BV. Women do not get BV from toilet seats, bedding, swimming pools or from touching objects around them.  SYMPTOMS   Grey vaginal discharge.  A fish-like odor with discharge, especially after sexual intercourse.  Itching or burning of the vagina and vulva.  Burning or pain with urination.  Some women have no signs or symptoms at all. DIAGNOSIS  Your caregiver must examine the vagina for signs of BV. Your caregiver will perform lab tests and look at the sample of vaginal fluid through a microscope. They will look for bacteria and abnormal cells (clue cells), a pH test higher than 4.5, and a positive amine test all associated with BV.  RISKS AND COMPLICATIONS   Pelvic inflammatory disease (PID).  Infections following gynecology surgery.  Developing HIV.  Developing herpes virus. TREATMENT  Sometimes BV will clear up without treatment. However, all women with symptoms of BV should be treated to avoid complications, especially if gynecology surgery is planned. Female partners generally do not need to be treated. However, BV may spread  between female sex partners so treatment is helpful in preventing a recurrence of BV.   BV may be treated with antibiotics. The antibiotics come in either pill or vaginal cream forms. Either can be used with nonpregnant or pregnant women, but the recommended dosages differ. These antibiotics are not harmful to the baby.  BV can recur after treatment. If this happens, a second round of antibiotics will often be prescribed.  Treatment is important for pregnant women. If not treated, BV can cause a premature delivery, especially for a pregnant woman who had a premature birth in the past. All pregnant women who have symptoms of BV should be checked and treated.  For chronic reoccurrence of BV, treatment with a type of prescribed gel vaginally twice a week is helpful. HOME CARE INSTRUCTIONS   Finish all medication as directed by your caregiver.  Do not have sex until treatment is completed.  Tell your sexual partner that you have a vaginal infection. They should see their caregiver and be treated if they have problems, such as a mild rash or itching.  Practice safe sex. Use condoms. Only have 1 sex partner. PREVENTION  Basic prevention steps can help reduce the risk of upsetting the natural balance of bacteria in the vagina and developing BV:  Do not have sexual intercourse (be abstinent).  Do not douche.  Use all of the medicine prescribed for treatment of BV, even if the signs and symptoms go away.  Tell your sex partner if you have BV. That way, they can be treated, if needed, to prevent reoccurrence. SEEK MEDICAL CARE IF:     Your symptoms are not improving after 3 days of treatment.  You have increased discharge, pain, or fever. MAKE SURE YOU:   Understand these instructions.  Will watch your condition.  Will get help right away if you are not doing well or get worse. FOR MORE INFORMATION  Division of STD Prevention (DSTDP), Centers for Disease Control and Prevention:  www.cdc.gov/std American Social Health Association (ASHA): www.ashastd.org  Document Released: 10/06/2005 Document Revised: 12/29/2011 Document Reviewed: 03/29/2009 ExitCare Patient Information 2013 [employer].  

## 2012-12-18 NOTE — Progress Notes (Signed)
[facility] [address] [place] [address] [phone]  Date:  12/18/2012   Name:  [name] [name]   DOB:  1961/09/07   MRN:  [id number]  PCP:  No primary provider on file.    Chief Complaint: Generalized Body Aches, Fatigue and Urinary Tract Infection   History of Present Illness:  [name] [name] is a 52 y.o. very pleasant female patient who presents with the following:  Ill with malaise and fatigue for the past week. Yesterday developed dysuria, urgency and frequency.  Now has "purulent" drainage from vagina.  No pelvic pain, no risk of pregnancy.  No nausea or vomiting.  Menses irregular.  Patient Active Problem List  Diagnosis  . Alcohol dependence    Past Medical History  Diagnosis Date  . Anorexia   . Alcohol abuse   . Depression   . Allergy   . Anemia   . Anxiety   . Asthma     Past Surgical History  Procedure Laterality Date  . Endometrial laproscopic      History  Substance Use Topics  . Smoking status: Current Every Day Smoker -- 0.25 packs/day for 5 years    Types: Cigarettes  . Smokeless tobacco: Never Used  . Alcohol Use: Yes     Comment: 1.5 fifth QD, last drink 10/29/11    Family History  Problem Relation Age of Onset  . Heart disease Mother   . Mental illness Maternal Grandmother   . Drug abuse Maternal Grandmother   . Cancer Maternal Grandfather   . Cancer Paternal Grandmother   . Cancer Paternal Grandfather   . Drug abuse Paternal Grandfather     Allergies  Allergen Reactions  . Sulfa Antibiotics Hives    Hives & edema    Medication list has been reviewed and updated.  Current Outpatient Prescriptions on File Prior to Visit  Medication Sig Dispense Refill  . traZODone (DESYREL) 50 MG tablet Take 1 tablet (50 mg total) by mouth at bedtime.  30 tablet  0   No current facility-administered medications on file prior to visit.    Review of Systems:  As per HPI, otherwise negative.     Physical Examination: Filed Vitals:   12/18/12 1442  BP: 89/50  Pulse: 70  Temp: 97.6 F (36.4 C)  Resp: 17   Filed Vitals:   12/18/12 1442  Height: 5' 6.5" (1.689 m)  Weight: 103 lb (46.72 kg)   Body mass index is 16.38 kg/(m^2). Ideal Body Weight: Weight in (lb) to have BMI = 25: 156.9  OBJECTIVE: BP 89/50  Pulse 70  Temp(Src) 97.6 F (36.4 C) (Oral)  Resp 17  Ht 5' 6.5" (1.689 m)  Wt 103 lb (46.72 kg)  BMI 16.38 kg/m2  SpO2 97%  GEN: WDWN, NAD, Non-toxic, A & O x 3 HEENT: Atraumatic, Normocephalic. Neck supple. No masses, No LAD. Ears and Nose: No external deformity. CV: RRR, No M/G/R. No JVD. No thrill. No extra heart sounds. PULM: CTA B, no wheezes, crackles, rhonchi. No retractions. No resp. distress. No accessory muscle use. ABD: S, NT, ND, +BS. No rebound. No HSM. EXTR: No c/c/e NEURO Normal gait.  PSYCH: Normally interactive. Conversant. Not depressed or anxious appearing.  Calm demeanor.  Pelvic - normal external genitalia, vulva, vagina, cervix, uterus and adnexa  Erythematous lesion on posterior vaginal orifice.  Thick mucoid white-yellow discharge    Assessment and Plan: BV flagyl  Carmelina Dane, MD  Results for orders placed in  visit on 12/18/12  POCT UA - MICROSCOPIC ONLY      Result Value Range   WBC, Ur, HPF, POC 15-20     RBC, urine, microscopic 0-1     Bacteria, U Microscopic trace     Mucus, UA moderate     Epithelial cells, urine per micros 8-13     Crystals, Ur, HPF, POC neg     Casts, Ur, LPF, POC neg     Yeast, UA neg     Amorphous moderate    POCT URINALYSIS DIPSTICK      Result Value Range   Color, UA yellow     Clarity, UA cloudy     Glucose, UA neg     Bilirubin, UA neg     Ketones, UA small     Spec Grav, UA 1.020     Blood, UA neg     pH, UA 8.0     Protein, UA 100     Urobilinogen, UA 0.2     Nitrite, UA neg     Leukocytes, UA small (1+)    POCT WET PREP WITH KOH      Result Value Range    Trichomonas, UA Negative     Clue Cells Wet Prep HPF POC 100%     Epithelial Wet Prep HPF POC 0-3     Yeast Wet Prep HPF POC neg     Bacteria Wet Prep HPF POC 2+     RBC Wet Prep HPF POC 0-2     WBC Wet Prep HPF POC tntc     KOH Prep POC Negative

## 2012-12-21 LAB — GC/CHLAMYDIA PROBE AMP: GC Probe RNA: NEGATIVE

## 2012-12-25 ENCOUNTER — Telehealth: Payer: Self-pay

## 2012-12-25 NOTE — Telephone Encounter (Signed)
Patient called for results of herpes cx. We have them back but they have not been reviewed. Please advise.

## 2012-12-25 NOTE — Telephone Encounter (Signed)
Pt is calling about lab results, she is wanting someone to call her back today.

## 2012-12-25 NOTE — Telephone Encounter (Signed)
Pt called for lab results, pt states this is her 3rd call.

## 2012-12-27 ENCOUNTER — Ambulatory Visit: Payer: BC Managed Care – PPO | Admitting: Emergency Medicine

## 2012-12-27 VITALS — BP 106/64 | HR 66 | Temp 98.3°F | Resp 16 | Ht 66.0 in | Wt 100.4 lb

## 2012-12-27 DIAGNOSIS — B009 Herpesviral infection, unspecified: Secondary | ICD-10-CM

## 2012-12-27 MED ORDER — VALACYCLOVIR HCL 1 G PO TABS: 1000.0000 mg | ORAL_TABLET | Freq: Every day | ORAL | Status: DC

## 2012-12-27 NOTE — Progress Notes (Signed)
[facility] [address], [place] [address] [phone]  Date:  12/27/2012   Name:  [name] [name]   DOB:  08-31-1961   MRN:  [id number]  PCP:  No primary provider on file.    Chief Complaint: Follow-up   History of Present Illness:  [name] [name] is a 52 y.o. 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  Has come in to discuss a strategy for treatment of the infection  Patient Active Problem List  Diagnosis  . Alcohol dependence    Past Medical History  Diagnosis Date  . Anorexia   . Alcohol abuse   . Depression   . Allergy   . Anemia   . Anxiety   . Asthma     Past Surgical History  Procedure Laterality Date  . Endometrial laproscopic      History  Substance Use Topics  . Smoking status: Current Every Day Smoker -- 0.25 packs/day for 5 years    Types: Cigarettes  . Smokeless tobacco: Never Used  . Alcohol Use: Yes     Comment: 1.5 fifth QD, last drink 10/29/11    Family History  Problem Relation Age of Onset  . Heart disease Mother   . Mental illness Maternal Grandmother   . Drug abuse Maternal Grandmother   . Cancer Maternal Grandfather   . Cancer Paternal Grandmother   . Cancer Paternal Grandfather   . Drug abuse Paternal Grandfather     Allergies  Allergen Reactions  . Sulfa Antibiotics Hives    Hives & edema    Medication list has been reviewed and updated.  Current Outpatient Prescriptions on File Prior to Visit  Medication Sig Dispense Refill  . ARIPiprazole (ABILIFY) 5 MG tablet Take 5 mg by mouth daily.      [name] buPROPion (WELLBUTRIN XL) 300 MG 24 hr tablet Take 300 mg by mouth daily.      [name] topiramate (TOPAMAX) 25 MG capsule Take 25 mg by mouth 2 (two) times daily.      . traZODone (DESYREL) 100 MG tablet Take 100 mg by mouth at bedtime.      . metroNIDAZOLE (FLAGYL) 500 MG tablet Take 1 tablet (500 mg total) by mouth 2  (two) times daily with a meal. DO NOT CONSUME ALCOHOL WHILE TAKING THIS MEDICATION.  14 tablet  0  . traZODone (DESYREL) 50 MG tablet Take 1 tablet (50 mg total) by mouth at bedtime.  30 tablet  0   No current facility-administered medications on file prior to visit.    Review of Systems:  As per HPI, otherwise negative.    Physical Examination: Filed Vitals:   12/27/12 1505  BP: 106/64  Pulse: 66  Temp: 98.3 F (36.8 C)  Resp: 16   Filed Vitals:   12/27/12 1505  Height: 5\' 6"  (1.676 m)  Weight: 100 lb 6.4 oz (45.541 kg)   Body mass index is 16.21 kg/(m^2). Ideal Body Weight: Weight in (lb) to have BMI = 25: 154.6   GEN: WDWN, NAD, Non-toxic, Alert & Oriented x 3 HEENT: Atraumatic, Normocephalic.  Ears and Nose: No external deformity. EXTR: No clubbing/cyanosis/edema NEURO: Normal gait.  PSYCH: Normally interactive. Conversant. Not depressed or anxious appearing.  Calm demeanor.    Assessment and Plan: Herpes 2 genital infection valtrex  Carmelina Dane, MD

## 2012-12-27 NOTE — Patient Instructions (Signed)
Genital Herpes  Genital herpes is a sexually transmitted disease. This means that it is a disease passed by having sex with an infected person. There is no cure for genital herpes. The time between attacks can be months to years. The virus may live in a person but produce no problems (symptoms). This infection can be passed to a baby as it travels down the birth canal (vagina). In a newborn, this can cause central nervous system damage, eye damage, or even death. The virus that causes genital herpes is usually HSV-2 virus. The virus that causes oral herpes is usually HSV-1. The diagnosis (learning what is wrong) is made through culture results.  SYMPTOMS   Usually symptoms of pain and itching begin a few days to a week after contact. It first appears as small blisters that progress to small painful ulcers which then scab over and heal after several days. It affects the outer genitalia, birth canal, cervix, penis, anal area, buttocks, and thighs.  HOME CARE INSTRUCTIONS    Keep ulcerated areas dry and clean.   Take medications as directed. Antiviral medications can speed up healing. They will not prevent recurrences or cure this infection. These medications can also be taken for suppression if there are frequent recurrences.   While the infection is active, it is contagious. Avoid all sexual contact during active infections.   Condoms may help prevent spread of the herpes virus.   Practice safe sex.   Wash your hands thoroughly after touching the genital area.   Avoid touching your eyes after touching your genital area.   Inform your caregiver if you have had genital herpes and become pregnant. It is your responsibility to insure a safe outcome for your baby in this pregnancy.   Only take over-the-counter or prescription medicines for pain, discomfort, or fever as directed by your caregiver.  SEEK MEDICAL CARE IF:    You have a recurrence of this infection.   You do not respond to medications and are not  improving.   You have new sources of pain or discharge which have changed from the original infection.   You have an oral temperature above 102 F (38.9 C).   You develop abdominal pain.   You develop eye pain or signs of eye infection.  Document Released: 10/03/2000 Document Revised: 12/29/2011 Document Reviewed: 10/24/2009  ExitCare Patient Information 2013 [employer].

## 2013-01-12 ENCOUNTER — Other Ambulatory Visit: Payer: Self-pay | Admitting: Obstetrics and Gynecology

## 2013-01-12 DIAGNOSIS — N6320 Unspecified lump in the left breast, unspecified quadrant: Secondary | ICD-10-CM

## 2013-02-14 ENCOUNTER — Other Ambulatory Visit: Payer: Self-pay

## 2013-06-08 ENCOUNTER — Other Ambulatory Visit: Payer: Self-pay | Admitting: Obstetrics and Gynecology

## 2013-09-10 ENCOUNTER — Ambulatory Visit: Payer: BC Managed Care – PPO | Admitting: Family Medicine

## 2013-09-10 VITALS — BP 82/54 | HR 76 | Temp 98.9°F | Resp 18 | Ht 66.0 in | Wt 114.4 lb

## 2013-09-10 DIAGNOSIS — K645 Perianal venous thrombosis: Secondary | ICD-10-CM

## 2013-09-10 DIAGNOSIS — K6289 Other specified diseases of anus and rectum: Secondary | ICD-10-CM

## 2013-09-10 DIAGNOSIS — Z23 Encounter for immunization: Secondary | ICD-10-CM

## 2013-09-10 MED ORDER — HYDROCORTISONE ACE-PRAMOXINE 1-1 % RE CREA: 1.0000 "application " | TOPICAL_CREAM | Freq: Three times a day (TID) | RECTAL | Status: DC

## 2013-09-10 MED ORDER — CIPROFLOXACIN HCL 500 MG PO TABS: 500.0000 mg | ORAL_TABLET | Freq: Two times a day (BID) | ORAL | Status: DC

## 2013-09-10 MED ORDER — HYDROCODONE-ACETAMINOPHEN 5-325 MG PO TABS: 1.0000 | ORAL_TABLET | ORAL | Status: DC | PRN

## 2013-09-10 NOTE — Patient Instructions (Signed)
Cleaned the area well twice or more daily.  Use Wet-Ones after using the toilet.  Take the pain pills if needed for pain  Starting Monday evening use the cream twice daily or 3 times daily on your bottom for a few days  Take the ciprofloxacin antibiotic one twice daily  Return if any concerns

## 2013-09-10 NOTE — Progress Notes (Signed)
Subjective: Patient has been having problems for about 6 or 7 days with rectal pain and a swollen hemorrhoid. She had these many years ago when she had her children. She is sitting in her car running back in for her fourth 2 [name] every day and classes which has aggravated things. She denies excessive straining or any constipation or anal irritation from external causes. She had a normal colonoscopy this year.  Objective: No acute distress. Abdomen soft without masses or tenderness. She has an obvious external hemorrhoid at about the 2:00 position. Discussed treatment options and she prefers to have an I&D performed. She has no allergies.  Procedure: Using 1% lidocaine with epinephrine the lesion was numbed up. Incision was made in the clot expressed. The area was explored a little bit with a hemostat and a small amount of additional clot obtained. The patient tolerated it well, and only minimal blood was still present.  Assessment: Anal pain Thrombosed external hemorrhoid Excision of thrombosed external hemorrhoid  Plan:  Instructions on care Pain pills Cipro for 5 days Analpram Return when necessary  Flu shot

## 2013-10-25 ENCOUNTER — Other Ambulatory Visit: Payer: Self-pay | Admitting: Family Medicine

## 2013-10-25 DIAGNOSIS — R29898 Other symptoms and signs involving the musculoskeletal system: Secondary | ICD-10-CM

## 2013-11-11 ENCOUNTER — Ambulatory Visit
Admission: RE | Admit: 2013-11-11 | Discharge: 2013-11-11 | Disposition: A | Discharge status: Home / Self Care | Payer: BC Managed Care – PPO | Source: Ambulatory Visit | Attending: Family Medicine | Admitting: Family Medicine

## 2013-11-11 DIAGNOSIS — R29898 Other symptoms and signs involving the musculoskeletal system: Secondary | ICD-10-CM

## 2013-11-11 MED ORDER — IOHEXOL 180 MG/ML  SOLN
1.0000 mL | Freq: Once | INTRAMUSCULAR | Status: AC | PRN
  Administered 2013-11-11: 1 mL via EPIDURAL

## 2013-11-11 MED ORDER — METHYLPREDNISOLONE ACETATE 40 MG/ML INJ SUSP (RADIOLOG
120.0000 mg | Freq: Once | INTRAMUSCULAR | Status: AC
  Administered 2013-11-11: 120 mg via EPIDURAL

## 2013-11-11 NOTE — Discharge Instructions (Signed)

## 2014-01-09 ENCOUNTER — Other Ambulatory Visit: Payer: Self-pay | Admitting: Family Medicine

## 2014-01-09 DIAGNOSIS — M5430 Sciatica, unspecified side: Secondary | ICD-10-CM

## 2014-01-16 ENCOUNTER — Other Ambulatory Visit: Payer: Self-pay | Admitting: Emergency Medicine

## 2014-02-06 ENCOUNTER — Other Ambulatory Visit: Payer: BC Managed Care – PPO

## 2014-02-24 ENCOUNTER — Ambulatory Visit
Admission: RE | Admit: 2014-02-24 | Discharge: 2014-02-24 | Disposition: A | Discharge status: Home / Self Care | Payer: BC Managed Care – PPO | Source: Ambulatory Visit | Attending: Family Medicine | Admitting: Family Medicine

## 2014-02-24 DIAGNOSIS — M5430 Sciatica, unspecified side: Secondary | ICD-10-CM

## 2014-02-24 MED ORDER — IOHEXOL 180 MG/ML  SOLN
1.0000 mL | Freq: Once | INTRAMUSCULAR | Status: AC | PRN
  Administered 2014-02-24: 1 mL via INTRATHECAL

## 2014-02-24 MED ORDER — METHYLPREDNISOLONE ACETATE 40 MG/ML INJ SUSP (RADIOLOG
120.0000 mg | Freq: Once | INTRAMUSCULAR | Status: AC
  Administered 2014-02-24: 120 mg via EPIDURAL

## 2014-02-24 NOTE — Discharge Instructions (Signed)

## 2014-06-07 ENCOUNTER — Other Ambulatory Visit: Payer: Self-pay | Admitting: Family Medicine

## 2014-06-07 DIAGNOSIS — M5440 Lumbago with sciatica, unspecified side: Secondary | ICD-10-CM

## 2014-06-23 ENCOUNTER — Ambulatory Visit
Admission: RE | Admit: 2014-06-23 | Discharge: 2014-06-23 | Disposition: A | Discharge status: Home / Self Care | Payer: BC Managed Care – PPO | Source: Ambulatory Visit | Attending: Family Medicine | Admitting: Family Medicine

## 2014-06-23 DIAGNOSIS — M5440 Lumbago with sciatica, unspecified side: Secondary | ICD-10-CM

## 2014-06-23 MED ORDER — IOHEXOL 180 MG/ML  SOLN
1.0000 mL | Freq: Once | INTRAMUSCULAR | Status: AC | PRN
  Administered 2014-06-23: 1 mL via EPIDURAL

## 2014-06-23 MED ORDER — METHYLPREDNISOLONE ACETATE 40 MG/ML INJ SUSP (RADIOLOG
120.0000 mg | Freq: Once | INTRAMUSCULAR | Status: AC
  Administered 2014-06-23: 120 mg via EPIDURAL

## 2014-06-23 NOTE — Discharge Instructions (Signed)

## 2014-07-23 ENCOUNTER — Other Ambulatory Visit: Payer: Self-pay | Admitting: Physician Assistant

## 2014-08-21 ENCOUNTER — Other Ambulatory Visit: Payer: Self-pay | Admitting: Orthopaedic Surgery

## 2014-08-21 DIAGNOSIS — M545 Low back pain, unspecified: Secondary | ICD-10-CM

## 2014-08-23 ENCOUNTER — Other Ambulatory Visit: Payer: Self-pay | Admitting: Physician Assistant

## 2014-08-23 ENCOUNTER — Ambulatory Visit
Admission: RE | Admit: 2014-08-23 | Discharge: 2014-08-23 | Disposition: A | Discharge status: Home / Self Care | Payer: BC Managed Care – PPO | Source: Ambulatory Visit | Attending: Orthopaedic Surgery | Admitting: Orthopaedic Surgery

## 2014-08-23 DIAGNOSIS — M545 Low back pain, unspecified: Secondary | ICD-10-CM

## 2014-09-07 ENCOUNTER — Other Ambulatory Visit: Payer: Self-pay | Admitting: Physician Assistant

## 2014-09-13 ENCOUNTER — Telehealth: Payer: Self-pay

## 2014-09-13 MED ORDER — VALACYCLOVIR HCL 1 G PO TABS: 1000.0000 mg | ORAL_TABLET | Freq: Every day | ORAL | Status: DC

## 2014-09-13 NOTE — Telephone Encounter (Signed)
Patient needs a refill on Valtrex. She states that she has already scheduled an appointment on 09/19/2014. Patient cannot be without her meds. [facility] [address].

## 2014-09-13 NOTE — Telephone Encounter (Signed)
Meds ordered this encounter  Medications  . valACYclovir (VALTREX) 1000 MG tablet    Sig: Take 1 tablet (1,000 mg total) by mouth daily.    Dispense:  30 tablet    Refill:  0    Order Specific Question:  Supervising Provider    Answer:  DOOLITTLE, ROBERT P [[id number]]

## 2014-09-15 MED ORDER — VALACYCLOVIR HCL 1 G PO TABS: 1000.0000 mg | ORAL_TABLET | Freq: Every day | ORAL | Status: DC

## 2014-09-15 NOTE — Telephone Encounter (Signed)
Sent in 1 mos RF and notified pt on VM.

## 2014-09-19 ENCOUNTER — Ambulatory Visit: Payer: BC Managed Care – PPO | Admitting: Family Medicine

## 2014-09-20 ENCOUNTER — Ambulatory Visit (INDEPENDENT_AMBULATORY_CARE_PROVIDER_SITE_OTHER): Payer: BC Managed Care – PPO | Admitting: Family Medicine

## 2014-09-20 ENCOUNTER — Encounter: Payer: Self-pay | Admitting: Family Medicine

## 2014-09-20 VITALS — BP 104/62 | HR 78 | Temp 98.0°F | Resp 16 | Ht 66.0 in | Wt 115.6 lb

## 2014-09-20 DIAGNOSIS — B009 Herpesviral infection, unspecified: Secondary | ICD-10-CM

## 2014-09-20 MED ORDER — VALACYCLOVIR HCL 500 MG PO TABS: 500.0000 mg | ORAL_TABLET | Freq: Every day | ORAL | Status: DC

## 2014-09-20 NOTE — Progress Notes (Signed)
   Subjective:    Patient ID: [name] [name], female    DOB: 11-Dec-1960, 53 y.o.   MRN: [id number]  HPI Patient presents today for follow up of refill medication request.  She sees Drs. McComb and Blumquist for gyn/pcp and receives regular care.   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  Has had one outbreak that lead to diagnosis 3/14. Has not had a further outbreak. She is no longer with the person she suspects of transmitting HSV to her. She has married someone else.  She has been tolerating the medication without side effects. She is concerned about coming off the medication and fearful of an outbreak.   Review of Systems No fever, no chills, no rash.     Objective:   Physical Exam Physical Exam  Vitals reviewed. Constitutional: She is oriented to person, place, and time. She appears well-developed and well-nourished.  HENT:  Head: Normocephalic and atraumatic.  Eyes: Conjunctivae are normal.  Neck: Normal range of motion. Neck supple.  Cardiovascular: Normal rate.   Pulmonary/Chest: Effort normal.  Musculoskeletal: Normal range of motion.  Neurological: She is alert and oriented to person, place, and time.  Skin: Skin is warm and dry.  Psychiatric: She has a normal mood and affect. Her behavior is normal. Judgment and thought content normal.     Assessment & Plan:  1. Herpes simplex type 2 infection -discussed coming off suppressive dose of valtrex since patient has not had outbreak in greater than a year. Patient very concerned and prefers to try lower dose for a couple of months.  - valACYclovir (VALTREX) 500 MG tablet; Take 1 tablet (500 mg total) by mouth daily.  Dispense: 30 tablet; Refill: 4 - She will contact me prior to finishing her refills and we will discuss coming off suppressive therapy if she continues to be outbreak free.   Emi Belfasteborah B. Gessner, FNP-BC  [facility], [facility]  09/21/2014 8:34 PM

## 2014-11-03 ENCOUNTER — Ambulatory Visit
Admission: RE | Admit: 2014-11-03 | Discharge: 2014-11-03 | Disposition: A | Discharge status: Home / Self Care | Payer: BLUE CROSS/BLUE SHIELD | Source: Ambulatory Visit | Attending: Orthopaedic Surgery | Admitting: Orthopaedic Surgery

## 2014-11-03 ENCOUNTER — Other Ambulatory Visit: Payer: Self-pay | Admitting: Orthopaedic Surgery

## 2014-11-03 ENCOUNTER — Other Ambulatory Visit: Payer: Self-pay | Admitting: Physician Assistant

## 2014-11-03 DIAGNOSIS — M41125 Adolescent idiopathic scoliosis, thoracolumbar region: Secondary | ICD-10-CM

## 2014-11-03 MED ORDER — GADOBENATE DIMEGLUMINE 529 MG/ML IV SOLN
10.0000 mL | Freq: Once | INTRAVENOUS | Status: AC | PRN
  Administered 2014-11-03: 10 mL via INTRAVENOUS

## 2014-11-05 ENCOUNTER — Other Ambulatory Visit: Payer: Self-pay | Admitting: Physician Assistant

## 2014-12-31 ENCOUNTER — Encounter (HOSPITAL_COMMUNITY): Payer: Self-pay

## 2014-12-31 ENCOUNTER — Emergency Department (HOSPITAL_COMMUNITY)
Admission: EM | Admit: 2014-12-31 | Discharge: 2014-12-31 | Disposition: A | Discharge status: Home / Self Care | Payer: BLUE CROSS/BLUE SHIELD | Attending: Emergency Medicine | Admitting: Emergency Medicine

## 2014-12-31 DIAGNOSIS — Z79899 Other long term (current) drug therapy: Secondary | ICD-10-CM | POA: Diagnosis not present

## 2014-12-31 DIAGNOSIS — R112 Nausea with vomiting, unspecified: Secondary | ICD-10-CM | POA: Diagnosis present

## 2014-12-31 DIAGNOSIS — J45909 Unspecified asthma, uncomplicated: Secondary | ICD-10-CM | POA: Insufficient documentation

## 2014-12-31 DIAGNOSIS — M549 Dorsalgia, unspecified: Secondary | ICD-10-CM | POA: Insufficient documentation

## 2014-12-31 DIAGNOSIS — Z8719 Personal history of other diseases of the digestive system: Secondary | ICD-10-CM | POA: Insufficient documentation

## 2014-12-31 DIAGNOSIS — Z862 Personal history of diseases of the blood and blood-forming organs and certain disorders involving the immune mechanism: Secondary | ICD-10-CM | POA: Diagnosis not present

## 2014-12-31 DIAGNOSIS — F329 Major depressive disorder, single episode, unspecified: Secondary | ICD-10-CM | POA: Insufficient documentation

## 2014-12-31 DIAGNOSIS — R1010 Upper abdominal pain, unspecified: Secondary | ICD-10-CM | POA: Diagnosis not present

## 2014-12-31 DIAGNOSIS — F419 Anxiety disorder, unspecified: Secondary | ICD-10-CM | POA: Diagnosis not present

## 2014-12-31 DIAGNOSIS — Z720 Tobacco use: Secondary | ICD-10-CM | POA: Diagnosis not present

## 2014-12-31 HISTORY — DX: Dorsalgia, unspecified: M54.9

## 2014-12-31 HISTORY — DX: Sciatica, unspecified side: M54.30

## 2014-12-31 HISTORY — DX: Acute pancreatitis without necrosis or infection, unspecified: K85.90

## 2014-12-31 LAB — COMPREHENSIVE METABOLIC PANEL
ALK PHOS: 61 U/L (ref 39–117)
ALT: 21 U/L (ref 0–35)
AST: 27 U/L (ref 0–37)
Albumin: 2.7 g/dL — ABNORMAL LOW (ref 3.5–5.2)
Anion gap: 8 (ref 5–15)
BUN: 10 mg/dL (ref 6–23)
CHLORIDE: 110 mmol/L (ref 96–112)
CO2: 23 mmol/L (ref 19–32)
CREATININE: 0.8 mg/dL (ref 0.50–1.10)
Calcium: 8.4 mg/dL (ref 8.4–10.5)
GFR calc Af Amer: >90 mL/min (ref >90)
GFR calc non Af Amer: 83 mL/min — ABNORMAL LOW (ref >90)
Glucose, Bld: 107 mg/dL — ABNORMAL HIGH (ref 70–99)
Potassium: 3.6 mmol/L (ref 3.5–5.1)
SODIUM: 141 mmol/L (ref 135–145)
Total Bilirubin: 0.1 mg/dL — ABNORMAL LOW (ref 0.3–1.2)
Total Protein: 5.6 g/dL — ABNORMAL LOW (ref 6.0–8.3)

## 2014-12-31 LAB — URINALYSIS, ROUTINE W REFLEX MICROSCOPIC
Glucose, UA: NEGATIVE mg/dL
Ketones, ur: NEGATIVE mg/dL
NITRITE: NEGATIVE
PH: 7 (ref 5.0–8.0)
PROTEIN: NEGATIVE mg/dL
SPECIFIC GRAVITY, URINE: 1.026 (ref 1.005–1.030)
Urobilinogen, UA: 1 mg/dL (ref 0.0–1.0)

## 2014-12-31 LAB — CBC WITH DIFFERENTIAL/PLATELET
BASOS ABS: 0.1 10*3/uL (ref 0.0–0.1)
Basophils Relative: 1 % (ref 0–1)
EOS PCT: 17 % — AB (ref 0–5)
Eosinophils Absolute: 1 10*3/uL — ABNORMAL HIGH (ref 0.0–0.7)
HEMATOCRIT: 34.4 % — AB (ref 36.0–46.0)
HEMOGLOBIN: 11 g/dL — AB (ref 12.0–15.0)
LYMPHS ABS: 1.3 10*3/uL (ref 0.7–4.0)
LYMPHS PCT: 22 % (ref 12–46)
MCH: 30.8 pg (ref 26.0–34.0)
MCHC: 32 g/dL (ref 30.0–36.0)
MCV: 96.4 fL (ref 78.0–100.0)
Monocytes Absolute: 0.4 10*3/uL (ref 0.1–1.0)
Monocytes Relative: 7 % (ref 3–12)
Neutro Abs: 3.2 10*3/uL (ref 1.7–7.7)
Neutrophils Relative %: 53 % (ref 43–77)
Platelets: 317 10*3/uL (ref 150–400)
RBC: 3.57 MIL/uL — ABNORMAL LOW (ref 3.87–5.11)
RDW: 13.9 % (ref 11.5–15.5)
WBC: 6 10*3/uL (ref 4.0–10.5)

## 2014-12-31 LAB — LIPASE, BLOOD: LIPASE: 27 U/L (ref 11–59)

## 2014-12-31 LAB — URINE MICROSCOPIC-ADD ON

## 2014-12-31 MED ORDER — PROMETHAZINE HCL 25 MG/ML IJ SOLN
12.5000 mg | Freq: Once | INTRAMUSCULAR | Status: AC
  Administered 2014-12-31: 12.5 mg via INTRAVENOUS
  Filled 2014-12-31: qty 1

## 2014-12-31 MED ORDER — PROMETHAZINE HCL 25 MG PO TABS: 25.0000 mg | ORAL_TABLET | Freq: Four times a day (QID) | ORAL | Status: DC | PRN

## 2014-12-31 MED ORDER — SODIUM CHLORIDE 0.9 % IV BOLUS (SEPSIS)
1000.0000 mL | Freq: Once | INTRAVENOUS | Status: AC
  Administered 2014-12-31: 1000 mL via INTRAVENOUS

## 2014-12-31 MED ORDER — FENTANYL CITRATE 0.05 MG/ML IJ SOLN
200.0000 ug | Freq: Once | INTRAMUSCULAR | Status: AC
  Administered 2014-12-31: 200 ug via INTRAVENOUS
  Filled 2014-12-31: qty 4

## 2014-12-31 MED ORDER — ONDANSETRON 8 MG PO TBDP: 8.0000 mg | ORAL_TABLET | Freq: Three times a day (TID) | ORAL | Status: DC | PRN

## 2014-12-31 NOTE — ED Notes (Addendum)
Pt c/o abdominal pain associated with N/V x 2 days.  Pt with hx of alcohol abuse last 2013.  Pt states she's had pancreatitis in the past.  Pt states she is chronically constipated d/t pain med use.  Pt states she has been taking a lot of laxatives.  Last BM this morning.

## 2014-12-31 NOTE — ED Notes (Signed)
Dr. Rubin PayorPickering notified that pt reports increased abd pain.

## 2014-12-31 NOTE — Discharge Instructions (Signed)

## 2014-12-31 NOTE — ED Provider Notes (Signed)
CSN: [id number]     Arrival date & time 12/31/14  0831 History   First MD Initiated Contact with Patient 12/31/14 [phone]     Chief Complaint  Patient presents with  . Nausea  . Emesis  . Abdominal Pain     (Consider location/radiation/quality/duration/timing/severity/associated sxs/prior Treatment) Patient is a 54 y.o. female presenting with vomiting and abdominal pain. The history is provided by the patient.  Emesis Severity:  Moderate Associated symptoms: abdominal pain   Associated symptoms: no chills, no diarrhea and no headaches   Abdominal Pain Associated symptoms: nausea and vomiting   Associated symptoms: no chest pain, no chills, no diarrhea, no fever and no shortness of breath    patient has had nausea vomiting upper abdominal pain for the last 4 days. States she's had a decreased appetite. She has chronic constipation which is unchanged. No fevers. The pain is dull. She has been vomiting stomach contents. She has a history of pancreatitis in the past and states that It Feels like That. 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 She Is on Chronic Pain Medicines and States There's Been No Recent Decrease in the Dose. No Chest Pain. No fevers. Patient states that she took some Zofran at home that has helped with the nausea.  Past Medical History  Diagnosis Date  . Anorexia   . Alcohol abuse   . Depression   . Allergy   . Anemia   . Anxiety   . Asthma   . Back pain   . Sciatica   . Pancreatitis    Past Surgical History  Procedure Laterality Date  . Endometrial laproscopic    . Back surgery     Family History  Problem Relation Age of Onset  . Heart disease Mother   . Mental illness Maternal Grandmother   . Drug abuse Maternal Grandmother   . Cancer Maternal Grandfather   . Cancer Paternal Grandmother   . Cancer Paternal Grandfather   . Drug abuse Paternal Grandfather    History  Substance Use Topics  . Smoking status: Current Every Day  Smoker -- 0.25 packs/day for 5 years    Types: Cigarettes  . Smokeless tobacco: Never Used  . Alcohol Use: Yes     Comment: 1.5 fifth QD, last drink 10/29/11   OB History    No data available     Review of Systems  Constitutional: Positive for appetite change. Negative for fever, chills and activity change.  Eyes: Negative for pain.  Respiratory: Negative for chest tightness and shortness of breath.   Cardiovascular: Negative for chest pain and leg swelling.  Gastrointestinal: Positive for nausea, vomiting and abdominal pain. Negative for diarrhea.  Genitourinary: Negative for flank pain.  Musculoskeletal: Positive for back pain. Negative for neck stiffness.  Skin: Negative for rash.  Neurological: Negative for weakness, numbness and headaches.  Psychiatric/Behavioral: Negative for behavioral problems.      Allergies  Sulfa antibiotics and Erythromycin  Home Medications   Prior to Admission medications   Medication Sig Start Date End Date Taking? Authorizing Provider  ALPRAZolam Prudy Feeler(XANAX) 0.5 MG tablet Take 0.25 mg by mouth as needed (panic attack).  12/22/14  Yes Historical Provider, MD  baclofen (LIORESAL) 20 MG tablet Take 20 mg by mouth daily as needed for muscle spasms.  09/12/14  Yes Historical Provider, MD  buPROPion (WELLBUTRIN XL) 300 MG 24 hr tablet Take 300 mg by mouth daily.   Yes Historical Provider, MD  busPIRone (BUSPAR) 10 MG tablet Take 20 mg by mouth 3 (three) times daily. 08/01/14  Yes Historical Provider, MD  Calcium Carbonate-Vitamin D (CALCIUM + D PO) Take 2 tablets by mouth daily.   Yes Historical Provider, MD  cetirizine (ZYRTEC) 10 MG tablet Take 10 mg by mouth daily as needed for allergies.   Yes Historical Provider, MD  docusate sodium (COLACE) 100 MG capsule Take 400 mg by mouth at bedtime.   Yes Historical Provider, MD  FLUoxetine (PROZAC) 20 MG capsule Take 40 mg by mouth daily.    Yes Historical Provider, MD  gabapentin (NEURONTIN) 600 MG tablet Take  600 mg by mouth 3 (three) times daily as needed.  12/08/14  Yes Historical Provider, MD  HYDROmorphone (DILAUDID) 4 MG tablet Take 4 mg by mouth every 4 (four) hours as needed for severe pain.   Yes Historical Provider, MD  ibuprofen (ADVIL,MOTRIN) 800 MG tablet Take 800 mg by mouth every 6 (six) hours as needed for mild pain.  11/17/14  Yes Historical Provider, MD  MINASTRIN 24 FE 1-20 MG-MCG(24) CHEW Chew 1 tablet by mouth daily.  09/16/14  Yes Historical Provider, MD  Multiple Vitamin (MULTIVITAMIN) tablet Take 1 tablet by mouth daily.   Yes Historical Provider, MD  polyethylene glycol powder (GLYCOLAX/MIRALAX) powder Take 1 Container by mouth 2 (two) times daily.   Yes Historical Provider, MD  pramipexole (MIRAPEX) 0.125 MG tablet Take 0.375 mg by mouth at bedtime as needed.  09/18/14  Yes Historical Provider, MD  tamsulosin (FLOMAX) 0.4 MG CAPS capsule Take 0.4 mg by mouth 2 (two) times daily. 11/17/14  Yes Historical Provider, MD  topiramate (TOPAMAX) 50 MG tablet Take 100 mg by mouth 2 (two) times daily.  09/07/14  Yes Historical Provider, MD  valACYclovir (VALTREX) 500 MG tablet Take 1 tablet (500 mg total) by mouth daily. 09/20/14  Yes Emi Belfast, FNP  HYDROcodone-acetaminophen (NORCO) 5-325 MG per tablet Take 1 tablet by mouth every 4 (four) hours as needed. Patient not taking: Reported on 12/31/2014 09/10/13   Peyton Najjar, MD  ondansetron (ZOFRAN-ODT) 8 MG disintegrating tablet Take 1 tablet (8 mg total) by mouth every 8 (eight) hours as needed for nausea or vomiting. 12/31/14   Benjiman Core, MD  promethazine (PHENERGAN) 25 MG tablet Take 1 tablet (25 mg total) by mouth every 6 (six) hours as needed for nausea. 12/31/14   Benjiman Core, MD   BP 110/63 mmHg  Pulse 72  Temp(Src) 97.7 F (36.5 C) (Oral)  Resp 19  Ht 5' 6.5" (1.689 m)  SpO2 98% Physical Exam  Constitutional: She is oriented to person, place, and time. She appears well-developed.  HENT:  Head: Normocephalic  and atraumatic.  Neck: Normal range of motion. Neck supple.  Cardiovascular: Normal rate, regular rhythm and normal heart sounds.   No murmur heard. Pulmonary/Chest: Effort normal and breath sounds normal. No respiratory distress. She has no wheezes. She has no rales.  Abdominal: Soft. She exhibits no distension. There is tenderness. There is no rebound and no guarding.  Mild upper abdominal tenderness without rebound or guarding.  Musculoskeletal: Normal range of motion.  Neurological: She is alert and oriented to person, place, and time. No cranial nerve deficit.  Skin: Skin is warm and dry.  Psychiatric: She has a normal mood and affect. Her speech is normal.  Nursing note and vitals reviewed.   ED Course  Procedures (including critical care time) Labs Review Labs Reviewed  CBC WITH DIFFERENTIAL/PLATELET - Abnormal;  Notable for the following:    RBC 3.57 (*)    Hemoglobin 11.0 (*)    HCT 34.4 (*)    Eosinophils Relative 17 (*)    Eosinophils Absolute 1.0 (*)    All other components within normal limits  COMPREHENSIVE METABOLIC PANEL - Abnormal; Notable for the following:    Glucose, Bld 107 (*)    Total Protein 5.6 (*)    Albumin 2.7 (*)    Total Bilirubin 0.1 (*)    GFR calc non Af Amer 83 (*)    All other components within normal limits  URINALYSIS, ROUTINE W REFLEX MICROSCOPIC - Abnormal; Notable for the following:    Color, Urine AMBER (*)    APPearance CLOUDY (*)    Hgb urine dipstick MODERATE (*)    Bilirubin Urine SMALL (*)    Leukocytes, UA SMALL (*)    All other components within normal limits  URINE MICROSCOPIC-ADD ON - Abnormal; Notable for the following:    Squamous Epithelial / LPF MANY (*)    Bacteria, UA FEW (*)    Crystals CA OXALATE CRYSTALS (*)    All other components within normal limits  URINE CULTURE  LIPASE, BLOOD    Imaging Review No results found.   EKG Interpretation None      MDM   Final diagnoses:  Non-intractable vomiting with  nausea, vomiting of unspecified type    Patient with nausea vomiting abdominal pain. Lab work looks reassuring. Does not appear dehydrated. Does not appear obstructed. Has tolerated orals in the ER. Did have few bacteria in urine but did have many squamous in the urine. Will discharge home.    Benjiman Core, MD 12/31/14 [phone]

## 2015-01-01 LAB — URINE CULTURE: Colony Count: 25000

## 2015-01-03 ENCOUNTER — Other Ambulatory Visit: Payer: Self-pay | Admitting: Family Medicine

## 2015-01-03 ENCOUNTER — Ambulatory Visit
Admission: RE | Admit: 2015-01-03 | Discharge: 2015-01-03 | Disposition: A | Discharge status: Home / Self Care | Payer: BLUE CROSS/BLUE SHIELD | Source: Ambulatory Visit | Attending: Family Medicine | Admitting: Family Medicine

## 2015-01-03 DIAGNOSIS — R1013 Epigastric pain: Secondary | ICD-10-CM

## 2015-01-03 MED ORDER — IOPAMIDOL (ISOVUE-300) INJECTION 61%
100.0000 mL | Freq: Once | INTRAVENOUS | Status: AC | PRN
  Administered 2015-01-03: 100 mL via INTRAVENOUS

## 2015-01-04 ENCOUNTER — Ambulatory Visit
Admission: RE | Admit: 2015-01-04 | Discharge: 2015-01-04 | Disposition: A | Discharge status: Home / Self Care | Payer: BLUE CROSS/BLUE SHIELD | Source: Ambulatory Visit | Attending: Family Medicine | Admitting: Family Medicine

## 2015-01-04 DIAGNOSIS — R1013 Epigastric pain: Secondary | ICD-10-CM

## 2015-01-05 ENCOUNTER — Other Ambulatory Visit: Payer: Self-pay | Admitting: Family Medicine

## 2015-01-05 DIAGNOSIS — R1013 Epigastric pain: Secondary | ICD-10-CM

## 2015-01-08 ENCOUNTER — Ambulatory Visit
Admission: RE | Admit: 2015-01-08 | Discharge: 2015-01-08 | Disposition: A | Discharge status: Home / Self Care | Payer: BLUE CROSS/BLUE SHIELD | Source: Ambulatory Visit | Attending: Family Medicine | Admitting: Family Medicine

## 2015-01-08 DIAGNOSIS — R1013 Epigastric pain: Secondary | ICD-10-CM

## 2015-02-06 ENCOUNTER — Other Ambulatory Visit: Payer: Self-pay | Admitting: Obstetrics and Gynecology

## 2015-02-07 LAB — CYTOLOGY - PAP

## 2015-03-01 ENCOUNTER — Other Ambulatory Visit: Payer: Self-pay | Admitting: Obstetrics and Gynecology

## 2015-03-22 ENCOUNTER — Other Ambulatory Visit: Payer: Self-pay | Admitting: Family Medicine

## 2015-03-26 ENCOUNTER — Other Ambulatory Visit (HOSPITAL_COMMUNITY): Payer: Self-pay | Admitting: Gastroenterology

## 2015-03-26 DIAGNOSIS — R1084 Generalized abdominal pain: Secondary | ICD-10-CM

## 2015-04-03 ENCOUNTER — Other Ambulatory Visit (HOSPITAL_COMMUNITY): Payer: BLUE CROSS/BLUE SHIELD

## 2015-04-03 ENCOUNTER — Ambulatory Visit (HOSPITAL_COMMUNITY): Admission: RE | Admit: 2015-04-03 | Payer: BLUE CROSS/BLUE SHIELD | Source: Ambulatory Visit

## 2015-04-03 ENCOUNTER — Ambulatory Visit (HOSPITAL_COMMUNITY): Payer: BLUE CROSS/BLUE SHIELD

## 2015-04-03 ENCOUNTER — Ambulatory Visit (HOSPITAL_COMMUNITY)
Admission: RE | Admit: 2015-04-03 | Discharge: 2015-04-03 | Disposition: A | Discharge status: Home / Self Care | Payer: BLUE CROSS/BLUE SHIELD | Source: Ambulatory Visit | Attending: Gastroenterology | Admitting: Gastroenterology

## 2015-04-03 DIAGNOSIS — R1084 Generalized abdominal pain: Secondary | ICD-10-CM | POA: Diagnosis present

## 2015-04-03 DIAGNOSIS — R112 Nausea with vomiting, unspecified: Secondary | ICD-10-CM | POA: Diagnosis not present

## 2015-04-03 MED ORDER — TECHNETIUM TC 99M MEBROFENIN IV KIT
5.0000 | PACK | Freq: Once | INTRAVENOUS | Status: AC | PRN
  Administered 2015-04-03: 5 via INTRAVENOUS

## 2015-04-03 MED ORDER — SINCALIDE 5 MCG IJ SOLR
INTRAMUSCULAR | Status: AC
  Administered 2015-04-03: 1.09 ug
  Filled 2015-04-03: qty 5

## 2015-04-03 MED ORDER — STERILE WATER FOR INJECTION IJ SOLN
INTRAMUSCULAR | Status: AC
  Filled 2015-04-03: qty 10

## 2015-04-10 ENCOUNTER — Other Ambulatory Visit (HOSPITAL_COMMUNITY): Payer: BLUE CROSS/BLUE SHIELD

## 2015-04-10 ENCOUNTER — Ambulatory Visit (HOSPITAL_COMMUNITY): Payer: BLUE CROSS/BLUE SHIELD

## 2015-04-12 ENCOUNTER — Other Ambulatory Visit: Payer: Self-pay | Admitting: Gastroenterology

## 2015-04-12 NOTE — Progress Notes (Signed)
I was unable to reach patient by phone.  I left  A message on voice mail.  I instructed the patient to arrive at [facility] Main entrance at 10:45  , nothing to eat or drink after midnight.   I instructed the patient to take the following medications in the am with just enough water to get them down: Wellburtin, Buspar, Tamsulosin.  Prn- Xanax, Inhalers bring rescue inhaler.  I asked patient to not wear any lotions, powders, cologne, jewelry, piercing, make-up or nail polish.  I asked the patient to call [phone], in the am if there were any questions or problems.

## 2015-04-13 ENCOUNTER — Ambulatory Visit (HOSPITAL_COMMUNITY): Payer: BLUE CROSS/BLUE SHIELD | Admitting: Certified Registered"

## 2015-04-13 ENCOUNTER — Encounter (HOSPITAL_COMMUNITY)
Admission: RE | Disposition: A | Discharge status: Home / Self Care | Payer: Self-pay | Source: Ambulatory Visit | Attending: Gastroenterology

## 2015-04-13 ENCOUNTER — Encounter (HOSPITAL_COMMUNITY): Payer: Self-pay | Admitting: Certified Registered"

## 2015-04-13 ENCOUNTER — Ambulatory Visit (HOSPITAL_COMMUNITY)
Admission: RE | Admit: 2015-04-13 | Discharge: 2015-04-13 | Disposition: A | Discharge status: Home / Self Care | Payer: BLUE CROSS/BLUE SHIELD | Source: Ambulatory Visit | Attending: Gastroenterology | Admitting: Gastroenterology

## 2015-04-13 DIAGNOSIS — Z79899 Other long term (current) drug therapy: Secondary | ICD-10-CM | POA: Insufficient documentation

## 2015-04-13 DIAGNOSIS — F419 Anxiety disorder, unspecified: Secondary | ICD-10-CM | POA: Insufficient documentation

## 2015-04-13 DIAGNOSIS — J449 Chronic obstructive pulmonary disease, unspecified: Secondary | ICD-10-CM | POA: Diagnosis not present

## 2015-04-13 DIAGNOSIS — F329 Major depressive disorder, single episode, unspecified: Secondary | ICD-10-CM | POA: Diagnosis not present

## 2015-04-13 DIAGNOSIS — K449 Diaphragmatic hernia without obstruction or gangrene: Secondary | ICD-10-CM | POA: Diagnosis not present

## 2015-04-13 DIAGNOSIS — J45909 Unspecified asthma, uncomplicated: Secondary | ICD-10-CM | POA: Diagnosis not present

## 2015-04-13 DIAGNOSIS — F17210 Nicotine dependence, cigarettes, uncomplicated: Secondary | ICD-10-CM | POA: Insufficient documentation

## 2015-04-13 DIAGNOSIS — R110 Nausea: Secondary | ICD-10-CM | POA: Diagnosis present

## 2015-04-13 DIAGNOSIS — D649 Anemia, unspecified: Secondary | ICD-10-CM | POA: Insufficient documentation

## 2015-04-13 DIAGNOSIS — R1010 Upper abdominal pain, unspecified: Secondary | ICD-10-CM | POA: Diagnosis present

## 2015-04-13 DIAGNOSIS — I2510 Atherosclerotic heart disease of native coronary artery without angina pectoris: Secondary | ICD-10-CM | POA: Insufficient documentation

## 2015-04-13 DIAGNOSIS — Z79891 Long term (current) use of opiate analgesic: Secondary | ICD-10-CM | POA: Insufficient documentation

## 2015-04-13 DIAGNOSIS — G8929 Other chronic pain: Secondary | ICD-10-CM | POA: Insufficient documentation

## 2015-04-13 DIAGNOSIS — K319 Disease of stomach and duodenum, unspecified: Secondary | ICD-10-CM | POA: Insufficient documentation

## 2015-04-13 HISTORY — PX: ESOPHAGOGASTRODUODENOSCOPY (EGD) WITH PROPOFOL: SHX5813

## 2015-04-13 HISTORY — DX: Adverse effect of unspecified anesthetic, initial encounter: T41.45XA

## 2015-04-13 HISTORY — DX: Other specified postprocedural states: Z98.890

## 2015-04-13 HISTORY — DX: Nausea with vomiting, unspecified: R11.2

## 2015-04-13 HISTORY — DX: Other complications of anesthesia, initial encounter: T88.59XA

## 2015-04-13 SURGERY — ESOPHAGOGASTRODUODENOSCOPY (EGD) WITH PROPOFOL
Anesthesia: Monitor Anesthesia Care

## 2015-04-13 MED ORDER — ONDANSETRON HCL 4 MG/2ML IJ SOLN
INTRAMUSCULAR | Status: DC | PRN
  Administered 2015-04-13: 4 mg via INTRAVENOUS

## 2015-04-13 MED ORDER — BUTAMBEN-TETRACAINE-BENZOCAINE 2-2-14 % EX AERO
INHALATION_SPRAY | CUTANEOUS | Status: DC | PRN
  Administered 2015-04-13: 2 via TOPICAL

## 2015-04-13 MED ORDER — SCOPOLAMINE 1 MG/3DAYS TD PT72
1.0000 | MEDICATED_PATCH | TRANSDERMAL | Status: DC
  Administered 2015-04-13: 1.5 mg via TRANSDERMAL

## 2015-04-13 MED ORDER — SCOPOLAMINE 1 MG/3DAYS TD PT72
MEDICATED_PATCH | TRANSDERMAL | Status: AC
  Filled 2015-04-13: qty 1

## 2015-04-13 MED ORDER — SODIUM CHLORIDE 0.9 % IV SOLN: INTRAVENOUS | Status: DC

## 2015-04-13 MED ORDER — LIDOCAINE HCL (CARDIAC) 20 MG/ML IV SOLN
INTRAVENOUS | Status: DC | PRN
  Administered 2015-04-13: 100 mg via INTRAVENOUS

## 2015-04-13 MED ORDER — LACTATED RINGERS IV SOLN
INTRAVENOUS | Status: DC
  Administered 2015-04-13: 11:00:00 via INTRAVENOUS

## 2015-04-13 MED ORDER — PROPOFOL INFUSION 10 MG/ML OPTIME
INTRAVENOUS | Status: DC | PRN
  Administered 2015-04-13: 200 ug/kg/min via INTRAVENOUS

## 2015-04-13 NOTE — Transfer of Care (Signed)
Immediate Anesthesia Transfer of Care Note  Patient: [name] [name]  Procedure(s) Performed: Procedure(s): ESOPHAGOGASTRODUODENOSCOPY (EGD) WITH PROPOFOL (N/A)  Patient Location: Endoscopy Unit  Anesthesia Type:MAC  Level of Consciousness: sedated and responds to stimulation  Airway & Oxygen Therapy: Patient Spontanous Breathing and Patient connected to nasal cannula oxygen  Post-op Assessment: Report given to RN, Post -op Vital signs reviewed and stable and Patient moving all extremities  Post vital signs: Reviewed and stable  Last Vitals:  Filed Vitals:   04/13/15 1200  BP: 117/70  Pulse: 69  Temp:   Resp: 16    Complications: No apparent anesthesia complications

## 2015-04-13 NOTE — Anesthesia Preprocedure Evaluation (Addendum)
Anesthesia Evaluation  Patient identified by MRN, date of birth, ID band Patient awake    Reviewed: Allergy & Precautions, NPO status , Patient's Chart, lab work & pertinent test results  History of Anesthesia Complications (+) PONV  Airway Mallampati: I  TM Distance: >3 FB Neck ROM: Full    Dental  (+) Teeth Intact, Dental Advisory Given   Pulmonary asthma , COPDCurrent Smoker,  breath sounds clear to auscultation        Cardiovascular negative cardio ROS  Rhythm:Regular Rate:Normal     Neuro/Psych Anxiety Depression negative neurological ROS     GI/Hepatic negative GI ROS, Neg liver ROS,   Endo/Other  negative endocrine ROS  Renal/GU negative Renal ROS     Musculoskeletal   Abdominal   Peds  Hematology negative hematology ROS (+)   Anesthesia Other Findings   Reproductive/Obstetrics                            Anesthesia Physical Anesthesia Plan  ASA: III  Anesthesia Plan: MAC and General   Post-op Pain Management:    Induction: Intravenous  Airway Management Planned: Natural Airway, Nasal Cannula and Oral ETT  Additional Equipment:   Intra-op Plan:   Post-operative Plan:   Informed Consent: I have reviewed the patients History and Physical, chart, labs and discussed the procedure including the risks, benefits and alternatives for the proposed anesthesia with the patient or authorized representative who has indicated his/her understanding and acceptance.   Dental advisory given  Plan Discussed with: CRNA, Anesthesiologist and Surgeon  Anesthesia Plan Comments:        Anesthesia Quick Evaluation

## 2015-04-13 NOTE — H&P (Signed)
Subjective:   Patient is a 54 y.o. female presents with chronic abdominal pain and nausea. She has been on chronic narcotics following back surgery. This continued to have abdominal pain. She has had CT scan ultrasound hepatobiliary scan all failing to show cause of her abdominal pain. EGD is performed.. Procedure including risks and benefits discussed in office.  Patient Active Problem List   Diagnosis Date Noted  . Alcohol dependence 11/08/2011   Past Medical History  Diagnosis Date  . Anorexia   . Alcohol abuse   . Depression   . Allergy   . Anemia   . Anxiety   . Asthma   . Back pain   . Sciatica   . Pancreatitis   . Complication of anesthesia   . PONV (postoperative nausea and vomiting)     Past Surgical History  Procedure Laterality Date  . Endometrial laproscopic    . Back surgery      Prescriptions prior to admission  Medication Sig Dispense Refill Last Dose  . ADVAIR DISKUS 250-50 MCG/DOSE AEPB Inhale 1 puff into the lungs 2 (two) times daily as needed (shortness of breath).   2 Past Month at Unknown time  . ALPRAZolam (XANAX) 0.5 MG tablet Take 0.25 mg by mouth as needed (panic attack).   0 04/12/2015 at Unknown time  . baclofen (LIORESAL) 20 MG tablet Take 20 mg by mouth at bedtime.   2 04/12/2015 at Unknown time  . buPROPion (WELLBUTRIN XL) 300 MG 24 hr tablet Take 300 mg by mouth daily.   04/13/2015 at Unknown time  . busPIRone (BUSPAR) 10 MG tablet Take 20 mg by mouth 3 (three) times daily.  1 04/13/2015 at Unknown time  . Calcium Carbonate-Vitamin D (CALCIUM + D PO) Take 1 tablet by mouth daily.    04/12/2015 at Unknown time  . cetirizine (ZYRTEC) 10 MG tablet Take 10 mg by mouth daily as needed for allergies.   04/12/2015 at Unknown time  . escitalopram (LEXAPRO) 10 MG tablet Take 10 mg by mouth daily.  1 04/13/2015 at Unknown time  . HYDROmorphone (DILAUDID) 4 MG tablet Take 4 mg by mouth every 4 (four) hours as needed for severe pain.   04/12/2015 at Unknown time   . MINASTRIN 24 FE 1-20 MG-MCG(24) CHEW Chew 1 tablet by mouth daily.   2 04/12/2015 at Unknown time  . ondansetron (ZOFRAN) 4 MG tablet Take 4 mg by mouth every 8 (eight) hours.  0 04/12/2015 at Unknown time  . polyethylene glycol powder (GLYCOLAX/MIRALAX) powder Take 1 Container by mouth daily.    04/12/2015 at Unknown time  . pramipexole (MIRAPEX) 0.125 MG tablet Take 0.375 mg by mouth at bedtime.   1 04/12/2015 at Unknown time  . PROAIR HFA 108 (90 BASE) MCG/ACT inhaler Inhale 2 puffs into the lungs every 4 (four) hours as needed for wheezing or shortness of breath.   0 Past Month at Unknown time  . promethazine (PHENERGAN) 25 MG tablet Take 1 tablet (25 mg total) by mouth every 6 (six) hours as needed for nausea. 20 tablet 0 04/12/2015 at Unknown time  . tamsulosin (FLOMAX) 0.4 MG CAPS capsule Take 0.4 mg by mouth 2 (two) times daily.  0 04/12/2015 at Unknown time  . topiramate (TOPAMAX) 50 MG tablet Take 50 mg by mouth 2 (two) times daily.   2 04/12/2015 at Unknown time  . traZODone (DESYREL) 100 MG tablet Take 100 mg by mouth at bedtime as needed for sleep.   3  04/12/2015 at Unknown time  . valACYclovir (VALTREX) 500 MG tablet TAKE 1 TABLET (500 MG TOTAL) BY MOUTH DAILY. 30 tablet 5 04/12/2015 at Unknown time  . HYDROcodone-acetaminophen (NORCO) 5-325 MG per tablet Take 1 tablet by mouth every 4 (four) hours as needed. (Patient not taking: Reported on 12/31/2014) 30 tablet 0 Not Taking at Unknown time  . ondansetron (ZOFRAN-ODT) 8 MG disintegrating tablet Take 1 tablet (8 mg total) by mouth every 8 (eight) hours as needed for nausea or vomiting. (Patient not taking: Reported on 04/10/2015) 20 tablet 0 Not Taking at Unknown time   Allergies  Allergen Reactions  . Sulfa Antibiotics Hives    Hives & edema  . Erythromycin Other (See Comments)    Flu-like symptoms    History  Substance Use Topics  . Smoking status: Current Every Day Smoker -- 0.25 packs/day for 5 years    Types: Cigarettes  .  Smokeless tobacco: Never Used  . Alcohol Use: Yes     Comment: 1.5 fifth QD, last drink 10/29/11    Family History  Problem Relation Age of Onset  . Heart disease Mother   . Mental illness Maternal Grandmother   . Drug abuse Maternal Grandmother   . Cancer Maternal Grandfather   . Cancer Paternal Grandmother   . Cancer Paternal Grandfather   . Drug abuse Paternal Grandfather      Objective:   Patient Vitals for the past 8 hrs:  BP Temp Temp src Pulse Resp SpO2 Height Weight  04/13/15 1112 (!) 103/55 mmHg 98.2 F (36.8 C) Oral 67 17 100 % 5' 6.5" (1.689 m) 52.164 kg (115 lb)         See MD Preop evaluation      Assessment:   1. Chronic abdominal pain. Etiology questionable  Plan:   Will proceed at this time with EGD. We use propofol sedation given her chronic use of dilaudid

## 2015-04-13 NOTE — Anesthesia Postprocedure Evaluation (Signed)
  Anesthesia Post-op Note  Patient: [name] [name]  Procedure(s) Performed: Procedure(s): ESOPHAGOGASTRODUODENOSCOPY (EGD) WITH PROPOFOL (N/A)  Patient Location: PACU  Anesthesia Type: MAC, General   Level of Consciousness: awake, alert  and oriented  Airway and Oxygen Therapy: Patient Spontanous Breathing  Post-op Pain: none  Post-op Assessment: Post-op Vital signs reviewed  Post-op Vital Signs: Reviewed  Last Vitals:  Filed Vitals:   04/13/15 1300  BP: 106/63  Pulse: 64  Temp:   Resp: 9    Complications: No apparent anesthesia complications

## 2015-04-13 NOTE — Op Note (Signed)
[facility] [address] [place], [address]   ENDOSCOPY PROCEDURE REPORT  PATIENT: Joslyne, Muirhead  MR#: [id number] BIRTHDATE: 09/20/1961 , 53  yrs. old GENDER: female ENDOSCOPIST:Kensi Karr Randa Evens, MD REFERRED BY: Mosetta Putt, M.D. PROCEDURE DATE:  04/13/2015 PROCEDURE:   EGD w/ biopsy ASA CLASS:    Class II INDICATIONS: persistent upper abdominal pain despite the use of PPI therapy.  CT scan of abdomen and pelvis, ultrasound of gallbladder, hepatobiliary scan all normal. MEDICATION: Propofol 300 mg IV TOPICAL ANESTHETIC:   Cetacaine Spray  DESCRIPTION OF PROCEDURE:   After the risks and benefits of the procedure were explained, informed consent was obtained.  The [employer] GASTOROSCOPE [id number]  endoscope was introduced through the mouth  and advanced to the second portion of the duodenum .  The instrument was slowly withdrawn as the mucosa was fully examined. Estimated blood loss is zero unless otherwise noted in this procedure report.      Esophagus normal without esophagitis or ulceration.  Small hiatal hernia widely patent GE junction.  STOMACH: No ulceration or other findings.  Biopsies obtained for Helicobacter staining.  DUODENUM: No evidence of gastric outlet obstruction or duodenal ulceration.    Retroflexed views revealed no abnormalities.    The scope was then withdrawn from the patient and the procedure completed.  COMPLICATIONS: There were no immediate complications.  ENDOSCOPIC IMPRESSION: 1.   Esophagus normal without esophagitis or ulceration.  Small hiatal hernia widely patent GE junction 2.   No ulceration or other findings.  Biopsies obtained for Helicobacter staining 3.   No evidence of gastric outlet obstruction or duodenal ulceration 4.    No explanation on EGD for her chronic abdominal pain RECOMMENDATIONS: We will discuss further workup with the patient and with Dr. Duaine Dredge Will consider  colonoscopy.   _______________________________ eSigned:  Carman Ching, MD 04/13/2015 12:30 PM     cc: Mosetta Putt, MD  CPT CODES: ICD CODES:  The ICD and CPT codes recommended by this software are interpretations from the data that the clinical staff has captured with the software.  The verification of the translation of this report to the ICD and CPT codes and modifiers is the sole responsibility of the health care institution and practicing physician where this report was generated.  [employer] will not be held responsible for the validity of the ICD and CPT codes included on this report.  AMA assumes no liability for data contained or not contained herein. CPT is a Publishing rights manager of the [employer].  PATIENT NAME:  Coralis, Trusty MR#: [id number]

## 2015-04-13 NOTE — Addendum Note (Signed)
Addended by: Samuel Germany. on: 04/13/2015 07:54 AM   Modules accepted: Orders

## 2015-04-13 NOTE — Discharge Instructions (Addendum)
Go to Dr Randa Evens office and pick up Linzess samples 1 tablet every AM  Monitored Anesthesia Care Monitored anesthesia care is an anesthesia service for a medical procedure. Anesthesia is the loss of the ability to feel pain. It is produced by medicines called anesthetics. It may affect a small area of your body (local anesthesia), a large area of your body (regional anesthesia), or your entire body (general anesthesia). The need for monitored anesthesia care depends your procedure, your condition, and the potential need for regional or general anesthesia. It is often provided during procedures where:   General anesthesia may be needed if there are complications. This is because you need special care when you are under general anesthesia.   You will be under local or regional anesthesia. This is so that you are able to have higher levels of anesthesia if needed.   You will receive calming medicines (sedatives). This is especially the case if sedatives are given to put you in a semi-conscious state of relaxation (deep sedation). This is because the amount of sedative needed to produce this state can be hard to predict. Too much of a sedative can produce general anesthesia. Monitored anesthesia care is performed by one or more health care providers who have special training in all types of anesthesia. You will need to meet with these health care providers before your procedure. During this meeting, they will ask you about your medical history. They will also give you instructions to follow. (For example, you will need to stop eating and drinking before your procedure. You may also need to stop or change medicines you are taking.) During your procedure, your health care providers will stay with you. They will:   Watch your condition. This includes watching your blood pressure, breathing, and level of pain.   Diagnose and treat problems that occur.   Give medicines if they are needed. These may  include calming medicines (sedatives) and anesthetics.   Make sure you are comfortable.  Having monitored anesthesia care does not necessarily mean that you will be under anesthesia. It does mean that your health care providers will be able to manage anesthesia if you need it or if it occurs. It also means that you will be able to have a different type of anesthesia than you are having if you need it. When your procedure is complete, your health care providers will continue to watch your condition. They will make sure any medicines wear off before you are allowed to go home.  Document Released: 07/02/2005 Document Revised: 02/20/2014 Document Reviewed: 11/17/2012 [facility] Patient Information 2015 [place], [place]. This information is not intended to replace advice given to you by your health care provider. Make sure you discuss any questions you have with your health care provider. Esophagogastroduodenoscopy Esophagogastroduodenoscopy (EGD) is a procedure to examine the lining of the esophagus, stomach, and first part of the small intestine (duodenum). A long, flexible, lighted tube with a camera attached (endoscope) is inserted down the throat to view these organs. This procedure is done to detect problems or abnormalities, such as inflammation, bleeding, ulcers, or growths, in order to treat them. The procedure lasts about 5-20 minutes. It is usually an outpatient procedure, but it may need to be performed in emergency cases in the hospital. LET YOUR CAREGIVER KNOW ABOUT:   Allergies to food or medicine.  All medicines you are taking, including vitamins, herbs, eyedrops, and over-the-counter medicines and creams.  Use of steroids (by mouth or creams).  Previous problems  you or members of your family have had with the use of anesthetics.  Any blood disorders you have.  Previous surgeries you have had.  Other health problems you have.  Possibility of pregnancy, if this applies. RISKS AND  COMPLICATIONS  Generally, EGD is a safe procedure. However, as with any procedure, complications can occur. Possible complications include:  Infection.  Bleeding.  Tearing (perforation) of the esophagus, stomach, or duodenum.  Difficulty breathing or not being able to breath.  Excessive sweating.  Spasms of the larynx.  Slowed heartbeat.  Low blood pressure. BEFORE THE PROCEDURE  Do not eat or drink anything for 6-8 hours before the procedure or as directed by your caregiver.  Ask your caregiver about changing or stopping your regular medicines.  If you wear dentures, be prepared to remove them before the procedure.  Arrange for someone to drive you home after the procedure. PROCEDURE   A vein will be accessed to give medicines and fluids. A medicine to relax you (sedative) and a pain reliever will be given through that access into the vein.  A numbing medicine (local anesthetic) may be sprayed on your throat for comfort and to stop you from gagging or coughing.  A mouth guard may be placed in your mouth to protect your teeth and to keep you from biting on the endoscope.  You will be asked to lie on your left side.  The endoscope is inserted down your throat and into the esophagus, stomach, and duodenum.  Air is put through the endoscope to allow your caregiver to view the lining of your esophagus clearly.  The esophagus, stomach, and duodenum is then examined. During the exam, your caregiver may:  Remove tissue to be examined under a microscope (biopsy) for inflammation, infection, or other medical problems.  Remove growths.  Remove objects (foreign bodies) that are stuck.  Treat any bleeding with medicines or other devices that stop tissues from bleeding (hot cautery, clipping devices).  Widen (dilate) or stretch narrowed areas of the esophagus and stomach.  The endoscope will then be withdrawn. AFTER THE PROCEDURE  You will be taken to a recovery area to be  monitored. You will be able to go home once you are stable and alert.  Do not eat or drink anything until the local anesthetic and numbing medicines have worn off. You may choke.  It is normal to feel bloated, have pain with swallowing, or have a sore throat for a short time. This will wear off.  Your caregiver should be able to discuss his or her findings with you. It will take longer to discuss the test results if any biopsies were taken. Document Released: 02/06/2005 Document Revised: 02/20/2014 Document Reviewed: 09/08/2012 [facility] Patient Information 2015 [place], [place]. This information is not intended to replace advice given to you by your health care provider. Make sure you discuss any questions you have with your health care provider.

## 2015-04-16 ENCOUNTER — Encounter (HOSPITAL_COMMUNITY): Payer: Self-pay | Admitting: Gastroenterology

## 2015-04-18 ENCOUNTER — Other Ambulatory Visit (HOSPITAL_COMMUNITY): Payer: Self-pay | Admitting: Gastroenterology

## 2015-04-18 DIAGNOSIS — R109 Unspecified abdominal pain: Secondary | ICD-10-CM

## 2015-05-01 ENCOUNTER — Ambulatory Visit (HOSPITAL_COMMUNITY)
Admission: RE | Admit: 2015-05-01 | Discharge: 2015-05-01 | Disposition: A | Discharge status: Home / Self Care | Payer: BLUE CROSS/BLUE SHIELD | Source: Ambulatory Visit | Attending: Gastroenterology | Admitting: Gastroenterology

## 2015-05-01 DIAGNOSIS — R109 Unspecified abdominal pain: Secondary | ICD-10-CM

## 2015-05-15 ENCOUNTER — Ambulatory Visit (HOSPITAL_COMMUNITY)
Admission: RE | Admit: 2015-05-15 | Discharge: 2015-05-15 | Disposition: A | Discharge status: Home / Self Care | Payer: BLUE CROSS/BLUE SHIELD | Source: Ambulatory Visit | Attending: Gastroenterology | Admitting: Gastroenterology

## 2015-05-15 ENCOUNTER — Other Ambulatory Visit (HOSPITAL_COMMUNITY): Payer: Self-pay | Admitting: Gastroenterology

## 2015-05-15 DIAGNOSIS — R109 Unspecified abdominal pain: Secondary | ICD-10-CM

## 2015-05-15 MED ORDER — TECHNETIUM TC 99M SULFUR COLLOID
2.0000 | Freq: Once | INTRAVENOUS | Status: AC | PRN
  Administered 2015-05-15: 2 via ORAL

## 2015-07-23 ENCOUNTER — Other Ambulatory Visit: Payer: Self-pay | Admitting: Obstetrics and Gynecology

## 2015-07-24 LAB — CYTOLOGY - PAP

## 2015-10-09 ENCOUNTER — Other Ambulatory Visit: Payer: Self-pay | Admitting: Family Medicine

## 2015-11-11 ENCOUNTER — Other Ambulatory Visit: Payer: Self-pay | Admitting: Family Medicine

## 2016-01-07 ENCOUNTER — Other Ambulatory Visit: Payer: Self-pay | Admitting: Family Medicine

## 2016-01-09 ENCOUNTER — Telehealth: Payer: Self-pay

## 2016-01-09 NOTE — Telephone Encounter (Addendum)
Pt states we refused to give her her Valtrex 500 mg and she isn't happy at all, was told she needed to come in but stated she can't but need her medicine NOW., cannot believe we wont take care of this She isn't happy. Please call pt at [phone]    [facility] [address]

## 2016-01-09 NOTE — Telephone Encounter (Signed)
I agree, RTC

## 2016-01-10 ENCOUNTER — Other Ambulatory Visit: Payer: Self-pay | Admitting: Family Medicine

## 2016-01-10 NOTE — Telephone Encounter (Signed)
We have not seen the patient in over a year - she will need an OV

## 2016-01-11 NOTE — Telephone Encounter (Signed)
Patient called concerning her medication refill. I reviewed the message to the patient stating she need an office visit. Patient said she will go else where to get the prescription refilled.

## 2016-02-06 IMAGING — US US ABDOMEN COMPLETE
1 series · 14 of 25 positions shown · non-contrast
Comparison: None.

CLINICAL DATA: Upper abdominal pain.  Nausea and vomiting.

EXAM:
ULTRASOUND ABDOMEN COMPLETE

[Series 1: us abdomen complete · 0.23mm/px · 14 of 87 slices shown]
[im 1/87]
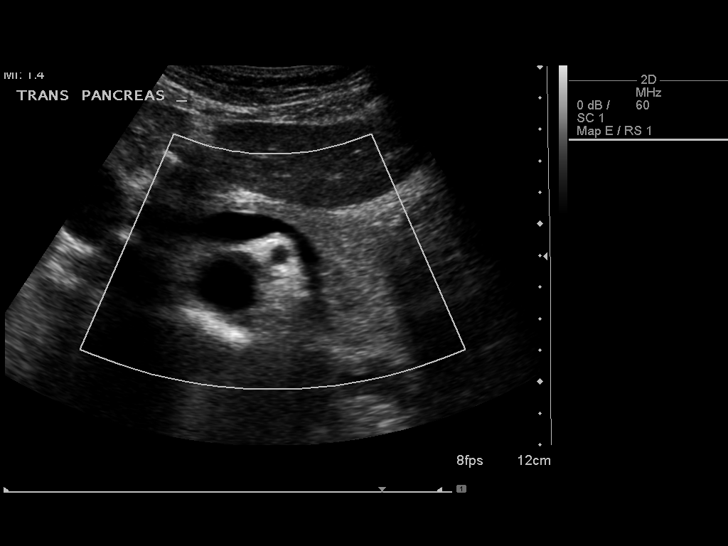
[im 8/87]
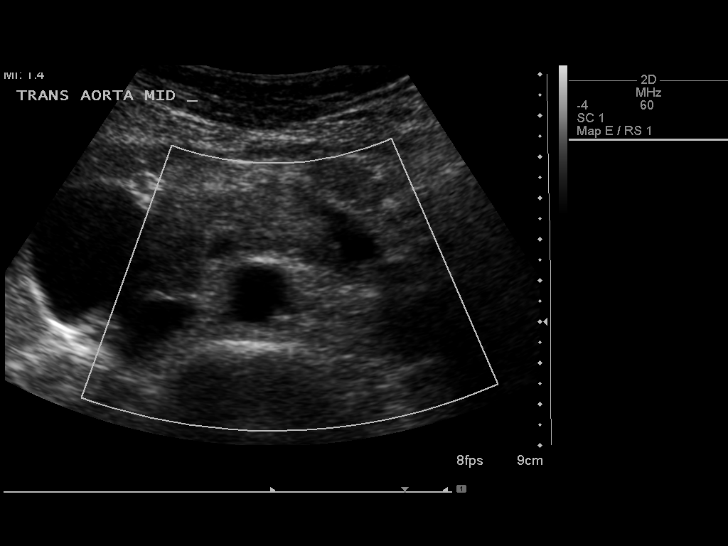
[im 15/87]
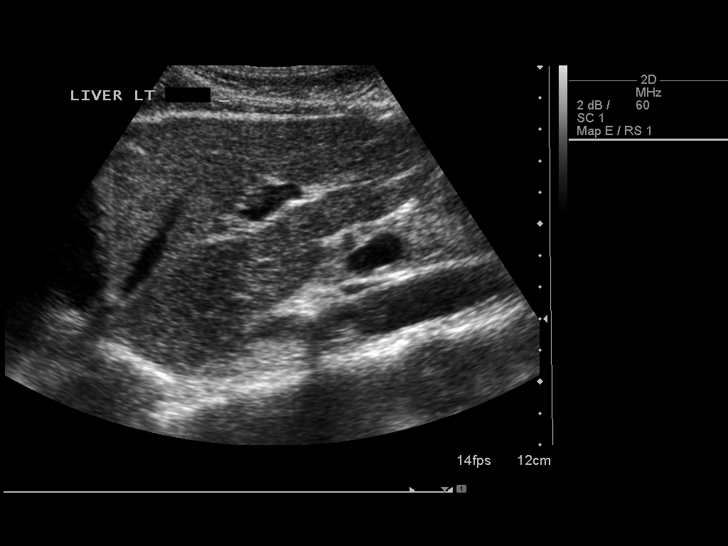
[im 22/87]
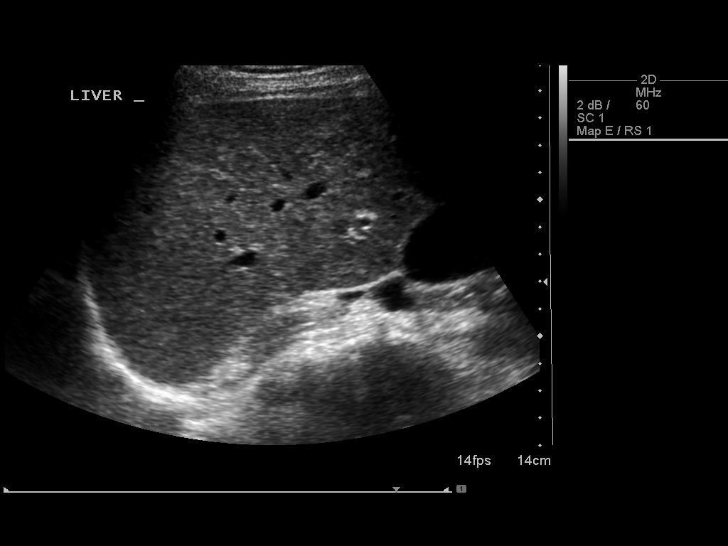
[im 29/87]
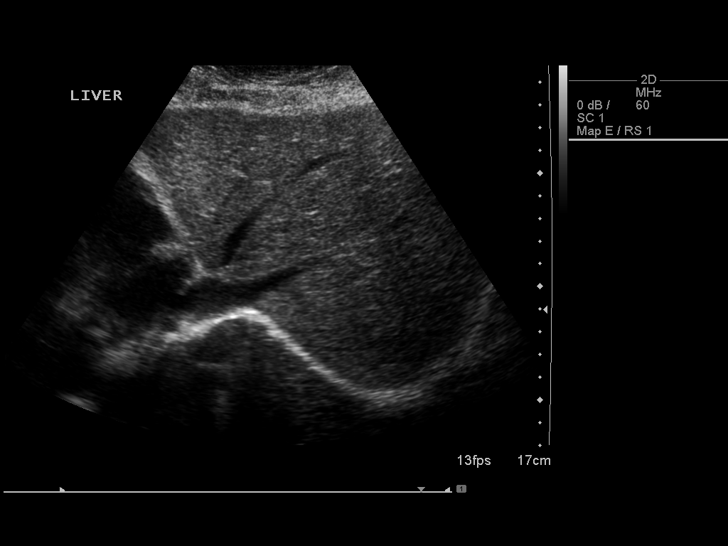
[im 33/87]
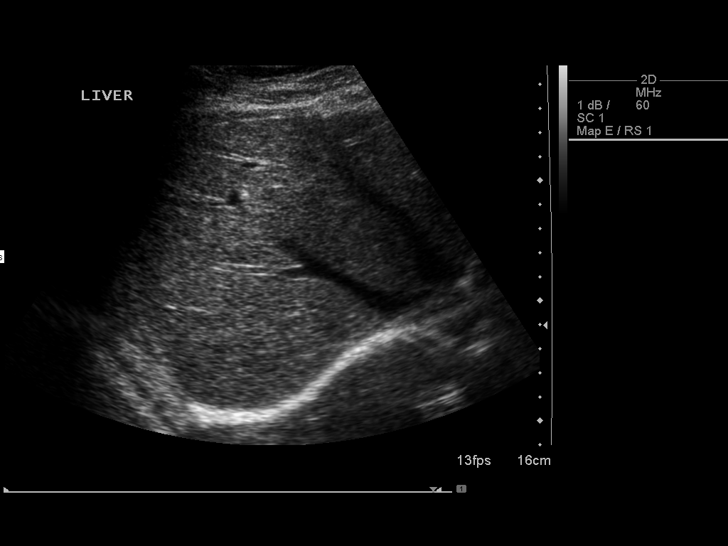
[im 40/87]
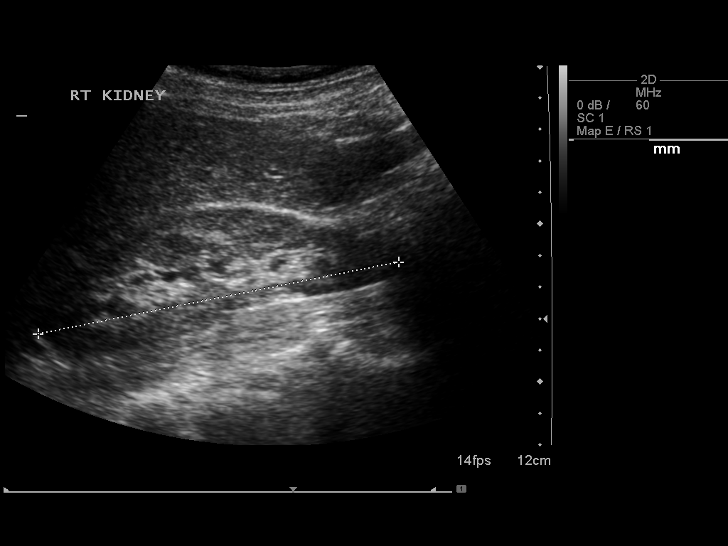
[im 47/87]
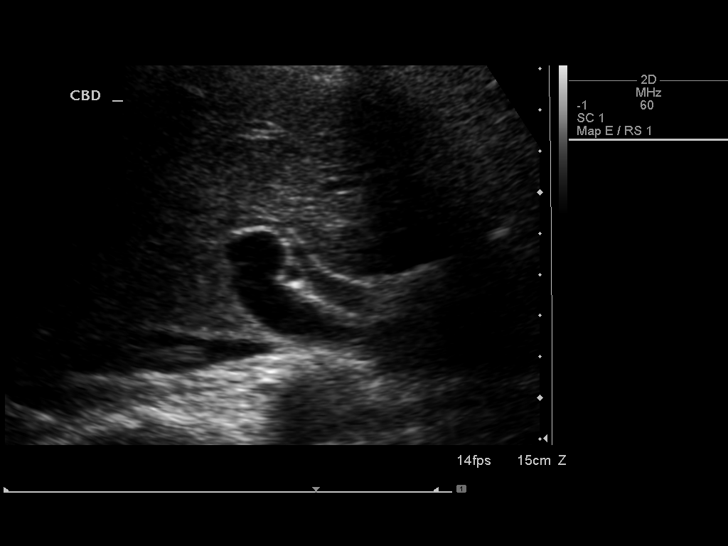
[im 54/87]
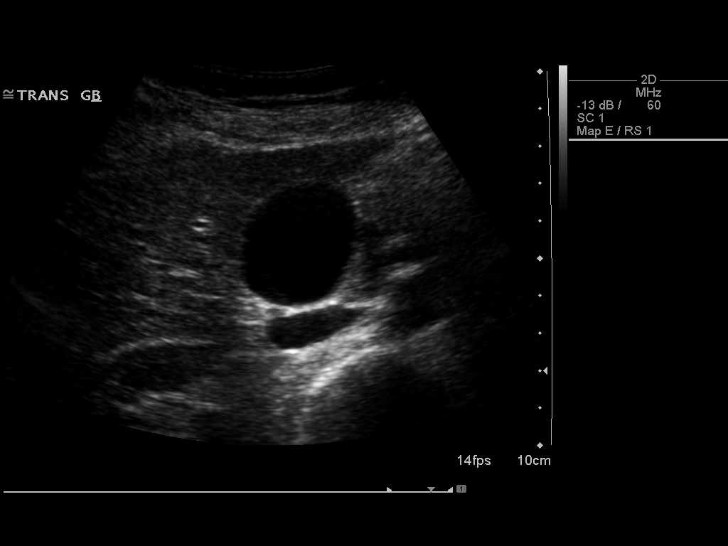
[im 58/87]
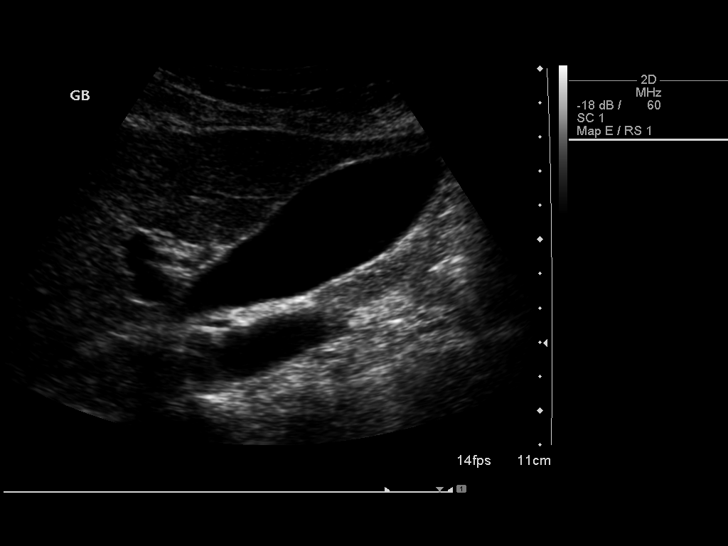
[im 65/87]
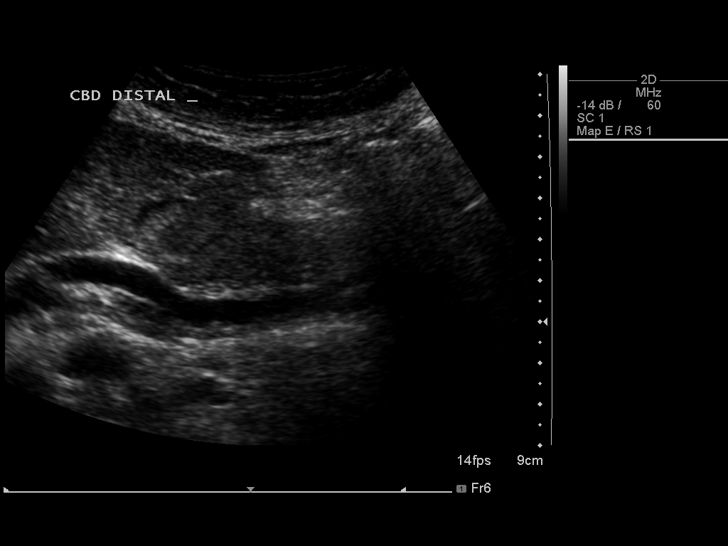
[im 72/87]
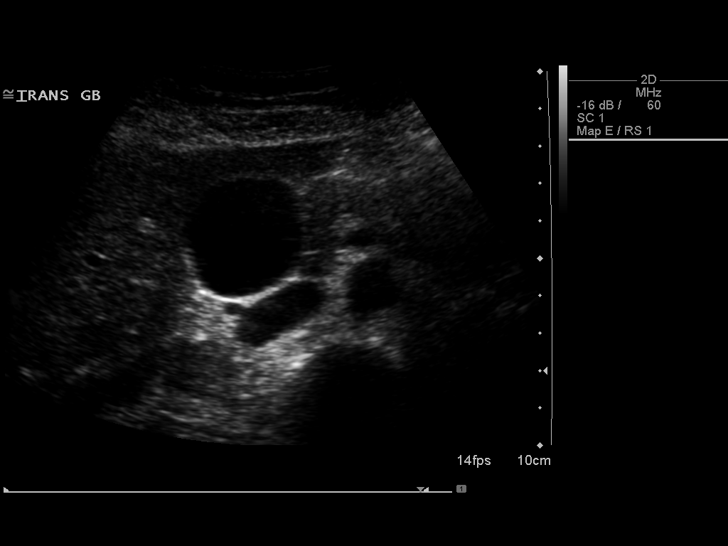
[im 79/87]
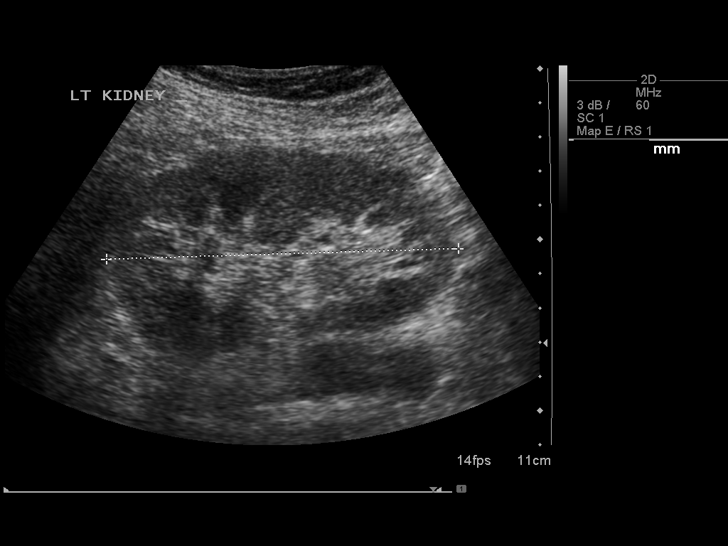
[im 87/87]
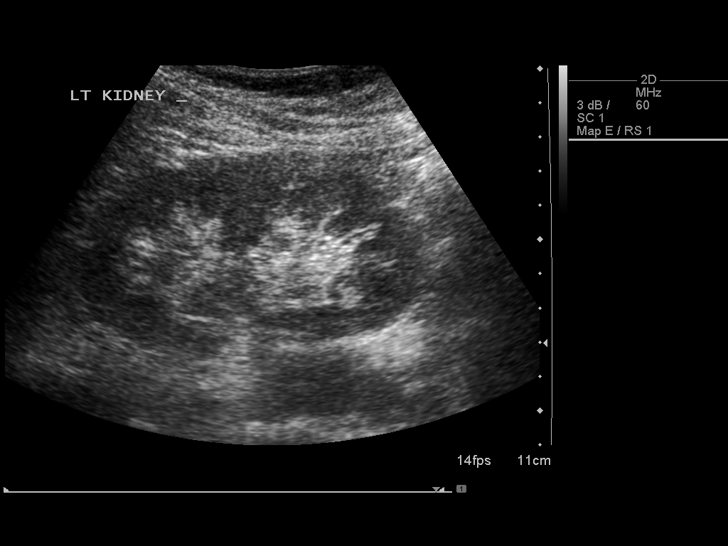

[14 of 25 positions shown; findings below may reference images not displayed]

FINDINGS: Gallbladder: No gallstones or wall thickening visualized. No
sonographic Murphy sign noted.

Common bile duct: Diameter: 7.8 mm, normal.

Liver: No focal lesion identified. Within normal limits in
parenchymal echogenicity.

IVC: No abnormality visualized.

Pancreas: Visualized portion unremarkable.

Spleen: 4.7 cm length, normal.

Right Kidney: Length: 11.7 cm. Echogenicity within normal limits. No
mass or hydronephrosis visualized.

Left Kidney: Length: 10.3 cm. Echogenicity within normal limits. No
mass or hydronephrosis visualized.

Abdominal aorta: No aneurysm visualized.  Maximum diameter 2.0 cm.

Other findings: None.
IMPRESSION: Normal exam.

## 2016-06-30 ENCOUNTER — Ambulatory Visit
Admission: RE | Admit: 2016-06-30 | Discharge: 2016-06-30 | Disposition: A | Discharge status: Home / Self Care | Payer: BLUE CROSS/BLUE SHIELD | Source: Ambulatory Visit | Attending: Family Medicine | Admitting: Family Medicine

## 2016-06-30 ENCOUNTER — Other Ambulatory Visit: Payer: Self-pay | Admitting: Family Medicine

## 2016-06-30 DIAGNOSIS — M25512 Pain in left shoulder: Secondary | ICD-10-CM

## 2016-07-15 ENCOUNTER — Other Ambulatory Visit: Payer: Self-pay | Admitting: Family Medicine

## 2016-07-15 DIAGNOSIS — M5416 Radiculopathy, lumbar region: Secondary | ICD-10-CM

## 2016-07-26 ENCOUNTER — Other Ambulatory Visit: Payer: BLUE CROSS/BLUE SHIELD

## 2016-07-26 ENCOUNTER — Ambulatory Visit
Admission: RE | Admit: 2016-07-26 | Discharge: 2016-07-26 | Disposition: A | Discharge status: Home / Self Care | Payer: BLUE CROSS/BLUE SHIELD | Source: Ambulatory Visit | Attending: Family Medicine | Admitting: Family Medicine

## 2016-07-26 DIAGNOSIS — M5416 Radiculopathy, lumbar region: Secondary | ICD-10-CM

## 2016-07-26 MED ORDER — GADOBENATE DIMEGLUMINE 529 MG/ML IV SOLN
15.0000 mL | Freq: Once | INTRAVENOUS | Status: AC | PRN
  Administered 2016-07-26: 11 mL via INTRAVENOUS

## 2016-08-13 ENCOUNTER — Other Ambulatory Visit: Payer: Self-pay | Admitting: Family Medicine

## 2016-08-13 DIAGNOSIS — M5416 Radiculopathy, lumbar region: Secondary | ICD-10-CM

## 2016-10-08 ENCOUNTER — Ambulatory Visit
Admission: RE | Admit: 2016-10-08 | Discharge: 2016-10-08 | Disposition: A | Discharge status: Home / Self Care | Payer: BLUE CROSS/BLUE SHIELD | Source: Ambulatory Visit | Attending: Family Medicine | Admitting: Family Medicine

## 2016-10-08 DIAGNOSIS — M5416 Radiculopathy, lumbar region: Secondary | ICD-10-CM

## 2016-10-08 MED ORDER — METHYLPREDNISOLONE ACETATE 40 MG/ML INJ SUSP (RADIOLOG
120.0000 mg | Freq: Once | INTRAMUSCULAR | Status: AC
  Administered 2016-10-08: 120 mg via INTRA_ARTICULAR

## 2016-10-08 MED ORDER — IOPAMIDOL (ISOVUE-M 200) INJECTION 41%
1.0000 mL | Freq: Once | INTRAMUSCULAR | Status: AC
  Administered 2016-10-08: 1 mL via INTRA_ARTICULAR

## 2016-10-08 NOTE — Discharge Instructions (Signed)

## 2018-07-12 DIAGNOSIS — F4310 Post-traumatic stress disorder, unspecified: Secondary | ICD-10-CM | POA: Insufficient documentation

## 2018-07-28 ENCOUNTER — Encounter: Payer: Self-pay | Admitting: Psychiatry

## 2018-07-28 ENCOUNTER — Ambulatory Visit: Payer: BLUE CROSS/BLUE SHIELD | Admitting: Psychiatry

## 2018-07-28 DIAGNOSIS — F331 Major depressive disorder, recurrent, moderate: Secondary | ICD-10-CM

## 2018-07-28 DIAGNOSIS — F4310 Post-traumatic stress disorder, unspecified: Secondary | ICD-10-CM | POA: Diagnosis not present

## 2018-07-28 DIAGNOSIS — F5001 Anorexia nervosa, restricting type: Secondary | ICD-10-CM | POA: Diagnosis not present

## 2018-07-28 DIAGNOSIS — F1021 Alcohol dependence, in remission: Secondary | ICD-10-CM | POA: Diagnosis not present

## 2018-07-28 NOTE — Progress Notes (Signed)
[facility]  Patient ID: [name] [name],  MRN: [id number]  PCP: Mosetta Putt, MD  Date of Evaluation: 07/28/2018 Time spent:30 minutes   HISTORY/CURRENT STATUS: HPI As well as I can be dt stress.  Work is primary. Sad over it.  Fctn acceptable.  Will request a transfer end of the year.  Doing the best I can.  Fights negativity and criticism.  Feels not a good match for the position. No recent panic.  Sleep not enough 4-5hours.  Long hours.  Yesterday 19 hours.   Individual Medical History/ Review of Systems: Changes? :Yes back pain.  Allergies: Sulfa antibiotics and Erythromycin  Current Medications:  Current Outpatient Medications:  .  buPROPion (WELLBUTRIN XL) 300 MG 24 hr tablet, Take 300 mg by mouth every morning. , Disp: , Rfl:  .  Calcium Carbonate-Vitamin D (CALCIUM + D PO), Take 1 tablet by mouth daily. , Disp: , Rfl:  .  celecoxib (CELEBREX) 200 MG capsule, Take 200 mg by mouth., Disp: , Rfl:  .  cloNIDine (CATAPRES) 0.1 MG tablet, Take 0.1 mg by mouth daily. 1/t tab qam & qhs, Disp: , Rfl:  .  pramipexole (MIRAPEX) 0.125 MG tablet, Take 0.375 mg by mouth at bedtime. , Disp: , Rfl: 1 .  topiramate (TOPAMAX) 50 MG tablet, Take 50 mg by mouth 2 (two) times daily. , Disp: , Rfl: 2 .  valACYclovir (VALTREX) 500 MG tablet, TAKE 1 TABLET (500 MG TOTAL) BY MOUTH DAILY "OFFICE VISIT NEEDED FOR REFILLS", Disp: 30 tablet, Rfl: 0 .  ADVAIR DISKUS 250-50 MCG/DOSE AEPB, Inhale 1 puff into the lungs 2 (two) times daily as needed (shortness of breath). , Disp: , Rfl: 2 .  baclofen (LIORESAL) 20 MG tablet, Take 20 mg by mouth at bedtime. , Disp: , Rfl: 2 .  cetirizine (ZYRTEC) 10 MG tablet, Take 10 mg by mouth daily as needed for allergies., Disp: , Rfl:  .  HYDROcodone-acetaminophen (NORCO) 5-325 MG per tablet, Take 1 tablet by mouth every 4 (four) hours as needed. (Patient not taking: Reported on 12/31/2014), Disp: 30 tablet, Rfl: 0 .  HYDROmorphone (DILAUDID) 4 MG  tablet, Take 4 mg by mouth every 4 (four) hours as needed for severe pain., Disp: , Rfl:  .  MINASTRIN 24 FE 1-20 MG-MCG(24) CHEW, Chew 1 tablet by mouth daily. , Disp: , Rfl: 2 .  ondansetron (ZOFRAN) 4 MG tablet, Take 4 mg by mouth every 8 (eight) hours., Disp: , Rfl: 0 .  ondansetron (ZOFRAN-ODT) 8 MG disintegrating tablet, Take 1 tablet (8 mg total) by mouth every 8 (eight) hours as needed for nausea or vomiting. (Patient not taking: Reported on 04/10/2015), Disp: 20 tablet, Rfl: 0 .  polyethylene glycol powder (GLYCOLAX/MIRALAX) powder, Take 1 Container by mouth daily. , Disp: , Rfl:  .  PROAIR HFA 108 (90 BASE) MCG/ACT inhaler, Inhale 2 puffs into the lungs every 4 (four) hours as needed for wheezing or shortness of breath. , Disp: , Rfl: 0 .  promethazine (PHENERGAN) 25 MG tablet, Take 1 tablet (25 mg total) by mouth every 6 (six) hours as needed for nausea. (Patient not taking: Reported on 07/28/2018), Disp: 20 tablet, Rfl: 0 .  tamsulosin (FLOMAX) 0.4 MG CAPS capsule, Take 0.4 mg by mouth 2 (two) times daily., Disp: , Rfl: 0 .  traZODone (DESYREL) 100 MG tablet, Take 100 mg by mouth at bedtime as needed for sleep. , Disp: , Rfl: 3 Medication Side Effects: None  Family Medical/ Social History: Changes?  Yes Started job in June and plans to stay 1 year. Still runs  MENTAL HEALTH EXAM:  There were no vitals taken for this visit.There is no height or weight on file to calculate BMI.  General Appearance: Meticulous  Eye Contact:  Good  Speech:  Normal Rate  Volume:  Normal  Mood:  Depressed  Affect:  Appropriate and Full Range  Thought Process:  Goal Directed  Orientation:  Full (Time, Place, and Person)  Thought Content: WDL   Suicidal Thoughts:  No  Homicidal Thoughts:  No  Memory:  Recent  Judgement:  Good  Insight:  Good  Psychomotor Activity:  Normal  Concentration:  Concentration: Good  Recall:  Good  Fund of Knowledge: Good  Language: Good  Akathisia:  No  AIMS (if  indicated): not done  Assets:  Communication Skills Desire for Improvement Financial Resources/Insurance Physical Health Talents/Skills Vocational/Educational  ADL's:  Intact  Cognition: WNL  Prognosis:  Good    DIAGNOSES:    ICD-10-CM   1. Major depressive disorder, recurrent episode, moderate (HCC) F33.1   2. PTSD (post-traumatic stress disorder) F43.10   3. Alcohol dependence in remission (HCC) F10.21   4. Anorexia nervosa, restricting type F50.01     RECOMMENDATIONS:  Work/life balance problems.  Get more sleep!  empahazied the importance. Danger driving while sleepy. Extensive discussion of potential complications.  Cont meds.  Disc sobriety.  Multiple relapse.  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  Lauraine Rinne, MD

## 2018-08-02 ENCOUNTER — Other Ambulatory Visit: Payer: Self-pay | Admitting: Obstetrics and Gynecology

## 2018-08-02 DIAGNOSIS — R921 Mammographic calcification found on diagnostic imaging of breast: Secondary | ICD-10-CM

## 2018-08-18 ENCOUNTER — Ambulatory Visit
Admission: RE | Admit: 2018-08-18 | Discharge: 2018-08-18 | Disposition: A | Discharge status: Home / Self Care | Payer: BLUE CROSS/BLUE SHIELD | Source: Ambulatory Visit | Attending: Obstetrics and Gynecology | Admitting: Obstetrics and Gynecology

## 2018-08-18 DIAGNOSIS — R921 Mammographic calcification found on diagnostic imaging of breast: Secondary | ICD-10-CM

## 2018-08-30 ENCOUNTER — Other Ambulatory Visit: Payer: Self-pay

## 2018-08-30 MED ORDER — CLONIDINE HCL 0.1 MG PO TABS: ORAL_TABLET | ORAL | 5 refills | Status: DC

## 2018-09-07 ENCOUNTER — Other Ambulatory Visit: Payer: Self-pay

## 2018-09-07 MED ORDER — BUPROPION HCL ER (XL) 300 MG PO TB24: 300.0000 mg | ORAL_TABLET | ORAL | 0 refills | Status: DC

## 2018-09-29 ENCOUNTER — Telehealth: Payer: Self-pay

## 2018-09-29 MED ORDER — TOPIRAMATE 50 MG PO TABS: 50.0000 mg | ORAL_TABLET | Freq: Two times a day (BID) | ORAL | 3 refills | Status: DC

## 2018-09-29 NOTE — Telephone Encounter (Signed)
Refill submitted. 

## 2018-11-29 ENCOUNTER — Other Ambulatory Visit: Payer: Self-pay | Admitting: Psychiatry

## 2018-12-14 ENCOUNTER — Other Ambulatory Visit: Payer: Self-pay | Admitting: Psychiatry

## 2018-12-16 ENCOUNTER — Telehealth: Payer: Self-pay | Admitting: Psychiatry

## 2018-12-16 ENCOUNTER — Other Ambulatory Visit: Payer: Self-pay | Admitting: Psychiatry

## 2018-12-16 NOTE — Telephone Encounter (Signed)
Refill already submitted to pharmacy

## 2018-12-16 NOTE — Telephone Encounter (Signed)
Patient requesting refill for Wellbutrin.[facility] [address] [place].

## 2019-01-08 ENCOUNTER — Other Ambulatory Visit: Payer: Self-pay | Admitting: Psychiatry

## 2019-01-12 ENCOUNTER — Ambulatory Visit (INDEPENDENT_AMBULATORY_CARE_PROVIDER_SITE_OTHER): Payer: BLUE CROSS/BLUE SHIELD | Admitting: Psychiatry

## 2019-01-12 ENCOUNTER — Other Ambulatory Visit: Payer: Self-pay

## 2019-01-12 ENCOUNTER — Encounter: Payer: Self-pay | Admitting: Psychiatry

## 2019-01-12 DIAGNOSIS — F5001 Anorexia nervosa, restricting type: Secondary | ICD-10-CM

## 2019-01-12 DIAGNOSIS — F4310 Post-traumatic stress disorder, unspecified: Secondary | ICD-10-CM

## 2019-01-12 DIAGNOSIS — F5105 Insomnia due to other mental disorder: Secondary | ICD-10-CM

## 2019-01-12 DIAGNOSIS — F331 Major depressive disorder, recurrent, moderate: Secondary | ICD-10-CM

## 2019-01-12 DIAGNOSIS — F1021 Alcohol dependence, in remission: Secondary | ICD-10-CM

## 2019-01-12 NOTE — Progress Notes (Signed)
[name] [name] [id number] 02-03-1961 58 y.o.  Subjective:   Patient ID:  [name] [name] is a 58 y.o. (DOB October 25, 1960) female.  Chief Complaint:  Chief Complaint  Patient presents with  . Depression  . Anxiety  . Follow-up    med management    HPI [name] presents to the office today for follow-up of several psychiatric diagnoses  Last seen July 28, 2018.  No medications were changed.  A lot has happened.  Not great.  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  Boss lied.  Stripped of license.  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  Lost entire reason for being.  Went through great loss before Xmas.  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  Moved to [place].  Starting over.  Pretty suicidal at [place] but kids kept her going.    Still battling depression most days.  Humbling in low pay job.  Taking it one day at a time.  Sleep OK, never a lot.  Poor sleep habit.  Bad habits of 4 hours nightly.  Lost some weight with the depression.  Not weighing herself right now.  3 weeks ago 101#.  Relapse alcohol around Xmas after kids left a few days.  Nothing major  Past Psychiatric Medication Trials:  Prozac flat, Lexapro, Zoloft, topiramate, trazodone hangover,    Review of Systems:  Review of Systems  Musculoskeletal: Negative for gait problem.  Neurological: Negative for dizziness, tremors and speech difficulty.  Psychiatric/Behavioral:       Please refer to HPI    Medications: I have reviewed the patient's current medications.  Current Outpatient Medications  Medication Sig Dispense Refill  . buPROPion (WELLBUTRIN XL) 300 MG 24 hr tablet TAKE 1 TABLET BY MOUTH EVERY DAY IN THE MORNING 90 tablet 1  . cloNIDine (CATAPRES) 0.1 MG tablet TAKE 1/2 TABLET IN THE MORNING AND 1 TABLET AT BEDTIME. 45 tablet 2  . topiramate (TOPAMAX) 50 MG tablet Take 1 tablet (50 mg total) by mouth 2 (two) times daily. 60 tablet 3  . valACYclovir (VALTREX) 500  MG tablet TAKE 1 TABLET (500 MG TOTAL) BY MOUTH DAILY "OFFICE VISIT NEEDED FOR REFILLS" 30 tablet 0  . ADVAIR DISKUS 250-50 MCG/DOSE AEPB Inhale 1 puff into the lungs 2 (two) times daily as needed (shortness of breath).   2  . baclofen (LIORESAL) 20 MG tablet Take 20 mg by mouth at bedtime.   2  . Calcium Carbonate-Vitamin D (CALCIUM + D PO) Take 1 tablet by mouth daily.     . celecoxib (CELEBREX) 200 MG capsule Take 200 mg by mouth.    . cetirizine (ZYRTEC) 10 MG tablet Take 10 mg by mouth daily as needed for allergies.    [name] HYDROcodone-acetaminophen (NORCO) 5-325 MG per tablet Take 1 tablet by mouth every 4 (four) hours as needed. (Patient not taking: Reported on 12/31/2014) 30 tablet 0  . HYDROmorphone (DILAUDID) 4 MG tablet Take 4 mg by mouth every 4 (four) hours as needed for severe pain.    [name] MINASTRIN 24 FE 1-20 MG-MCG(24) CHEW Chew 1 tablet by mouth daily.   2  . ondansetron (ZOFRAN) 4 MG tablet Take 4 mg by mouth every 8 (eight) hours.  0  . ondansetron (ZOFRAN-ODT) 8 MG disintegrating tablet Take 1 tablet (8 mg total) by mouth every 8 (eight) hours as needed for nausea or vomiting. (Patient not taking: Reported on 04/10/2015) 20 tablet 0  . polyethylene  glycol powder (GLYCOLAX/MIRALAX) powder Take 1 Container by mouth daily.     . pramipexole (MIRAPEX) 0.125 MG tablet Take 0.375 mg by mouth at bedtime.   1  . PROAIR HFA 108 (90 BASE) MCG/ACT inhaler Inhale 2 puffs into the lungs every 4 (four) hours as needed for wheezing or shortness of breath.   0  . promethazine (PHENERGAN) 25 MG tablet Take 1 tablet (25 mg total) by mouth every 6 (six) hours as needed for nausea. (Patient not taking: Reported on 07/28/2018) 20 tablet 0  . tamsulosin (FLOMAX) 0.4 MG CAPS capsule Take 0.4 mg by mouth 2 (two) times daily.  0  . traZODone (DESYREL) 100 MG tablet Take 100 mg by mouth at bedtime as needed for sleep.   3   No current facility-administered medications for this visit.     Medication Side  Effects: None  Allergies:  Allergies  Allergen Reactions  . Sulfa Antibiotics Hives    Hives & edema  . Erythromycin Other (See Comments)    Flu-like symptoms    Past Medical History:  Diagnosis Date  . Alcohol abuse   . Allergy   . Anemia   . Anorexia   . Anxiety   . Asthma   . Back pain   . Complication of anesthesia   . Depression   . Pancreatitis   . PONV (postoperative nausea and vomiting)   . Sciatica     Family History  Problem Relation Age of Onset  . Heart disease Mother   . Mental illness Maternal Grandmother   . Drug abuse Maternal Grandmother   . Cancer Maternal Grandfather   . Cancer Paternal Grandmother   . Cancer Paternal Grandfather   . Drug abuse Paternal Grandfather     Social History   Socioeconomic History  . Marital status: Married    Spouse name: Not on file  . Number of children: Not on file  . Years of education: Not on file  . Highest education level: Not on file  Occupational History  . Not on file  Social Needs  . Financial resource strain: Not on file  . Food insecurity:    Worry: Not on file    Inability: Not on file  . Transportation needs:    Medical: Not on file    Non-medical: Not on file  Tobacco Use  . Smoking status: Current Every Day Smoker    Packs/day: 0.25    Years: 5.00    Pack years: 1.25    Types: Cigarettes  . Smokeless tobacco: Never Used  Substance and Sexual Activity  . Alcohol use: Yes    Comment: 1.5 fifth QD, last drink 10/29/11  . Drug use: No  . Sexual activity: Not Currently    Birth control/protection: I.U.D.  Lifestyle  . Physical activity:    Days per week: Not on file    Minutes per session: Not on file  . Stress: Not on file  Relationships  . Social connections:    Talks on phone: Not on file    Gets together: Not on file    Attends religious service: Not on file    Active member of club or organization: Not on file    Attends meetings of clubs or organizations: Not on file     Relationship status: Not on file  . Intimate partner violence:    Fear of current or ex partner: Not on file    Emotionally abused: Not on file    Physically  abused: Not on file    Forced sexual activity: Not on file  Other Topics Concern  . Not on file  Social History Narrative  . Not on file    Past Medical History, Surgical history, Social history, and Family history were reviewed and updated as appropriate.   Please see review of systems for further details on the patient's review from today.   Objective:   Physical Exam:  There were no vitals taken for this visit.  Physical Exam Constitutional:      General: She is not in acute distress.    Appearance: She is well-developed.  Musculoskeletal:        General: No deformity.  Neurological:     Mental Status: She is alert and oriented to person, place, and time.     Cranial Nerves: No dysarthria.     Coordination: Coordination normal.  Psychiatric:        Attention and Perception: Attention and perception normal. She does not perceive auditory or visual hallucinations.        Mood and Affect: Mood is anxious and depressed. Affect is not labile, blunt, angry or inappropriate.        Speech: Speech normal.        Behavior: Behavior normal.        Thought Content: Thought content normal. Thought content does not include homicidal or suicidal ideation. Thought content does not include homicidal or suicidal plan.        Cognition and Memory: Cognition and memory normal.        Judgment: Judgment normal.     Comments: Insight intact. No delusions.  She feels that the depression and anxiety are situational and appropriate to the circumstances and not interfering with function     Lab Review:     Component Value Date/Time   NA 141 12/31/2014 0904   K 3.6 12/31/2014 0904   CL 110 12/31/2014 0904   CO2 23 12/31/2014 0904   GLUCOSE 107 (H) 12/31/2014 0904   BUN 10 12/31/2014 0904   CREATININE 0.80 12/31/2014 0904    CALCIUM 8.4 12/31/2014 0904   PROT 5.6 (L) 12/31/2014 0904   ALBUMIN 2.7 (L) 12/31/2014 0904   AST 27 12/31/2014 0904   ALT 21 12/31/2014 0904   ALKPHOS 61 12/31/2014 0904   BILITOT 0.1 (L) 12/31/2014 0904   GFRNONAA 83 (L) 12/31/2014 0904   GFRAA >90 12/31/2014 0904       Component Value Date/Time   WBC 6.0 12/31/2014 0904   RBC 3.57 (L) 12/31/2014 0904   HGB 11.0 (L) 12/31/2014 0904   HCT 34.4 (L) 12/31/2014 0904   PLT 317 12/31/2014 0904   MCV 96.4 12/31/2014 0904   MCH 30.8 12/31/2014 0904   MCHC 32.0 12/31/2014 0904   RDW 13.9 12/31/2014 0904   LYMPHSABS 1.3 12/31/2014 0904   MONOABS 0.4 12/31/2014 0904   EOSABS 1.0 (H) 12/31/2014 0904   BASOSABS 0.1 12/31/2014 0904    No results found for: POCLITH, LITHIUM   No results found for: PHENYTOIN, PHENOBARB, VALPROATE, CBMZ   .res Assessment: Plan:    Major depressive disorder, recurrent episode, moderate (HCC)  PTSD (post-traumatic stress disorder)  Alcohol dependence in remission (HCC)  Anorexia nervosa, restricting type  Insomnia due to mental condition   Deyra has a long history of multiple psychiatric problems as noted.  They intertwine and affect 1 another.  Fortunately currently she is sober.  Her anorexia makes her resistant to taking psychiatric medications that cause  any risk of weight gain.  This greatly limits other antidepressant options though she could likely benefit from additional antidepressants.  I am hesitant to increase the Wellbutrin out of fear that it may further suppress her eating.  Work on Physiological scientist.  Chronically sleep deprived.  Get more sleep.  This contributes to her psychiatric instability.  She says she will work on it.  Emphasized eating enough protein.  No further weight loss.  Discussed eating disorder issues.  Hates SSRI.  Offered low dosage to help with the depression.  She refuses.  She doesn't want more antidepressant right now.  Functioning OK.  Promises to call if  gets worse.She feels that the depression and anxiety are situational and appropriate to the circumstances and not interfering with function   Maintain sobriety.  Make use of AA as you have done in the past.  This appt was 30 mins.  Rec counseling ASAP.  She agrees  Call if the depression worsens or suicidal thoughts return.  She agrees  Fu 3 mos  I connected with patient by a video enabled telemedicine application or telephone, with their informed consent, and verified patient privacy and that I am speaking with the correct person using two identifiers.  I was located work and patient car.  Meredith Staggers, MD, DFAPA    Please see After Visit Summary for patient specific instructions.  No future appointments.  No orders of the defined types were placed in this encounter.     -------------------------------

## 2019-03-17 ENCOUNTER — Telehealth: Payer: Self-pay | Admitting: Psychiatry

## 2019-03-17 ENCOUNTER — Other Ambulatory Visit: Payer: Self-pay

## 2019-03-17 MED ORDER — BUPROPION HCL ER (XL) 300 MG PO TB24: ORAL_TABLET | ORAL | 1 refills | Status: DC

## 2019-03-17 NOTE — Telephone Encounter (Signed)
Pharmacy address is [address] [place] [phone]

## 2019-03-17 NOTE — Telephone Encounter (Signed)
Pt called to request refill on Wellbutrin. Needs Rx sent to [facility] on [address] [place]. Will need all refills sent to this pharmacy. She has moved to [place]

## 2019-03-17 NOTE — Telephone Encounter (Signed)
Refill submitted and pharmacy updated in chart

## 2019-04-04 ENCOUNTER — Telehealth: Payer: Self-pay | Admitting: Psychiatry

## 2019-04-04 NOTE — Telephone Encounter (Signed)
Need refill on Totiramate to be sent to [facility] on [address]in [place] #[phone].  Able to get medication cheaper

## 2019-04-05 ENCOUNTER — Other Ambulatory Visit: Payer: Self-pay

## 2019-04-05 MED ORDER — TOPIRAMATE 50 MG PO TABS: 50.0000 mg | ORAL_TABLET | Freq: Two times a day (BID) | ORAL | 3 refills | Status: DC

## 2019-04-05 NOTE — Telephone Encounter (Signed)
Refill submitted. 

## 2019-04-30 ENCOUNTER — Other Ambulatory Visit: Payer: Self-pay | Admitting: Psychiatry

## 2019-05-30 ENCOUNTER — Telehealth: Payer: Self-pay | Admitting: Psychiatry

## 2019-05-30 ENCOUNTER — Other Ambulatory Visit: Payer: Self-pay

## 2019-05-30 MED ORDER — CLONIDINE HCL 0.1 MG PO TABS: ORAL_TABLET | ORAL | 1 refills | Status: DC

## 2019-05-30 NOTE — Telephone Encounter (Signed)
Refill submitted per request.

## 2019-05-30 NOTE — Telephone Encounter (Signed)
Patient need you to send a 30 day supply for Clonidine to [facility] on [address] [phone]

## 2019-06-21 ENCOUNTER — Encounter: Payer: Self-pay | Admitting: Psychiatry

## 2019-06-21 ENCOUNTER — Encounter: Payer: BLUE CROSS/BLUE SHIELD | Admitting: Psychiatry

## 2019-06-21 ENCOUNTER — Other Ambulatory Visit: Payer: Self-pay

## 2019-06-21 DIAGNOSIS — F50019 Anorexia nervosa, restricting type, unspecified: Secondary | ICD-10-CM

## 2019-06-21 DIAGNOSIS — F1021 Alcohol dependence, in remission: Secondary | ICD-10-CM

## 2019-06-21 DIAGNOSIS — F4310 Post-traumatic stress disorder, unspecified: Secondary | ICD-10-CM

## 2019-06-21 DIAGNOSIS — F5105 Insomnia due to other mental disorder: Secondary | ICD-10-CM

## 2019-06-21 DIAGNOSIS — F331 Major depressive disorder, recurrent, moderate: Secondary | ICD-10-CM

## 2019-06-21 DIAGNOSIS — F5001 Anorexia nervosa, restricting type: Secondary | ICD-10-CM

## 2019-06-21 NOTE — Progress Notes (Signed)
This encounter was created in error - please disregard.

## 2019-06-30 ENCOUNTER — Other Ambulatory Visit: Payer: Self-pay

## 2019-06-30 ENCOUNTER — Ambulatory Visit (INDEPENDENT_AMBULATORY_CARE_PROVIDER_SITE_OTHER): Payer: BLUE CROSS/BLUE SHIELD | Admitting: Psychiatry

## 2019-06-30 DIAGNOSIS — Z8659 Personal history of other mental and behavioral disorders: Secondary | ICD-10-CM

## 2019-06-30 DIAGNOSIS — Z599 Problem related to housing and economic circumstances, unspecified: Secondary | ICD-10-CM

## 2019-06-30 DIAGNOSIS — F331 Major depressive disorder, recurrent, moderate: Secondary | ICD-10-CM

## 2019-06-30 DIAGNOSIS — Z598 Other problems related to housing and economic circumstances: Secondary | ICD-10-CM
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
## 2019-07-19 ENCOUNTER — Encounter: Payer: Self-pay | Admitting: Psychiatry

## 2019-07-19 ENCOUNTER — Other Ambulatory Visit: Payer: Self-pay

## 2019-07-19 ENCOUNTER — Ambulatory Visit (INDEPENDENT_AMBULATORY_CARE_PROVIDER_SITE_OTHER): Payer: BLUE CROSS/BLUE SHIELD | Admitting: Psychiatry

## 2019-07-19 ENCOUNTER — Ambulatory Visit (INDEPENDENT_AMBULATORY_CARE_PROVIDER_SITE_OTHER): Payer: Self-pay | Admitting: Psychiatry

## 2019-07-19 DIAGNOSIS — F1021 Alcohol dependence, in remission: Secondary | ICD-10-CM

## 2019-07-19 DIAGNOSIS — Z599 Problem related to housing and economic circumstances, unspecified: Secondary | ICD-10-CM

## 2019-07-19 DIAGNOSIS — Z8659 Personal history of other mental and behavioral disorders: Secondary | ICD-10-CM

## 2019-07-19 DIAGNOSIS — F331 Major depressive disorder, recurrent, moderate: Secondary | ICD-10-CM

## 2019-07-19 DIAGNOSIS — Z77120 Contact with and (suspected) exposure to mold (toxic): Secondary | ICD-10-CM

## 2019-07-19 DIAGNOSIS — Z598 Other problems related to housing and economic circumstances: Secondary | ICD-10-CM

## 2019-07-19 DIAGNOSIS — F4310 Post-traumatic stress disorder, unspecified: Secondary | ICD-10-CM

## 2019-07-19 DIAGNOSIS — F5105 Insomnia due to other mental disorder: Secondary | ICD-10-CM

## 2019-07-19 MED ORDER — BUPROPION HCL ER (XL) 150 MG PO TB24: 150.0000 mg | ORAL_TABLET | Freq: Every day | ORAL | 0 refills | Status: DC

## 2019-07-19 NOTE — Progress Notes (Signed)
[name] [name] [id number] Jan 25, 1961 58 y.o.  Subjective:   Patient ID:  [name] [name] is a 58 y.o. (DOB Jan 25, 1961) female.  Chief Complaint:  Chief Complaint  Patient presents with  . Anxiety    HPI [name] Chestnutlizabeth Cohick presents to the office today for follow-up of several psychiatric diagnoses  Last seen January 12, 2019.[name].  No medications were changed.  Fired from [employer] after [employer] employee of the month.  Doesn't know why and doesn't understand it.  Stressed by this.  Was falsely accused of making false statements on the Covid test and yet she didn't do it. Caused her to feel more depressed.    Filed for unemployment.  Had an interview for [employer] and has a history with them.  Went well.    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    Fine with meds.  Consistent.  Sleep problems related to the stress but getting better with some lethargy.  Low energy and motivation and reduced concentration.  Had some of it before this job loss.  Wants to try increase Wellbutrin  No alcohol relapses but definitely more stressed and angry.  Wanting to withdraw.    No diet cokes.  3 cups coffee.  [place].  Being to be outside is good for her mental health. Runs. [facility] care app and [facility] helped med costs.  Past Psychiatric Medication Trials:  Prozac flat, Lexapro, Zoloft, topiramate, trazodone hangover, Wellbutrin XL 300   Review of Systems:  Review of Systems  Musculoskeletal: Negative for gait problem.  Neurological: Negative for dizziness, tremors and speech difficulty.  Psychiatric/Behavioral: Negative for agitation, behavioral problems, confusion, decreased concentration and suicidal ideas.       Please refer to HPI    Medications: I have reviewed the patient's current medications.  Current Outpatient Medications  Medication Sig Dispense Refill  . buPROPion (WELLBUTRIN XL) 300 MG 24 hr tablet TAKE 1 TABLET BY MOUTH EVERY DAY IN THE MORNING 90  tablet 1  . Calcium Carbonate-Vitamin D (CALCIUM + D PO) Take 1 tablet by mouth daily.     . celecoxib (CELEBREX) 200 MG capsule Take 200 mg by mouth.    . cetirizine (ZYRTEC) 10 MG tablet Take 10 mg by mouth daily as needed for allergies.    . cloNIDine (CATAPRES) 0.1 MG tablet TAKE 1/2 TABLET IN THE MORNING AND 1 TABLET AT BEDTIME. (Patient taking differently: Take 0.1 mg by mouth at bedtime. ) 45 tablet 1  . Multiple Vitamins-Minerals (WOMENS 50+ MULTI VITAMIN/MIN PO) Take by mouth.    . pramipexole (MIRAPEX) 0.125 MG tablet Take 0.375 mg by mouth at bedtime.   1  . pregabalin (LYRICA) 150 MG capsule Take 150 mg by mouth at bedtime.    . topiramate (TOPAMAX) 50 MG tablet Take 1 tablet (50 mg total) by mouth 2 (two) times daily. 60 tablet 3  . triamterene-hydrochlorothiazide (MAXZIDE-25) 37.5-25 MG tablet TAKE 1/2 (ONE HALF) TABLET EVERY MORNING AS NEEDED FOR FLUID RETENTION     No current facility-administered medications for this visit.     Medication Side Effects: None  Allergies:  Allergies  Allergen Reactions  . Sulfa Antibiotics Hives    Hives & edema  . Erythromycin Other (See Comments)    Flu-like symptoms    Past Medical History:  Diagnosis Date  . Alcohol abuse   . Allergy   . Anemia   . Anorexia   . Anxiety   . Asthma   . Back pain   .  Complication of anesthesia   . Depression   . Pancreatitis   . PONV (postoperative nausea and vomiting)   . Sciatica     Family History  Problem Relation Age of Onset  . Heart disease Mother   . Mental illness Maternal Grandmother   . Drug abuse Maternal Grandmother   . Cancer Maternal Grandfather   . Cancer Paternal Grandmother   . Cancer Paternal Grandfather   . Drug abuse Paternal Grandfather     Social History   Socioeconomic History  . Marital status: Married    Spouse name: Not on file  . Number of children: Not on file  . Years of education: Not on file  . Highest education level: Not on file  Occupational  History  . Not on file  Social Needs  . Financial resource strain: Not on file  . Food insecurity    Worry: Not on file    Inability: Not on file  . Transportation needs    Medical: Not on file    Non-medical: Not on file  Tobacco Use  . Smoking status: Current Every Day Smoker    Packs/day: 0.25    Years: 5.00    Pack years: 1.25    Types: Cigarettes  . Smokeless tobacco: Never Used  Substance and Sexual Activity  . Alcohol use: Yes    Comment: 1.5 fifth QD, last drink 10/29/11  . Drug use: No  . Sexual activity: Not Currently    Birth control/protection: I.U.D.  Lifestyle  . Physical activity    Days per week: Not on file    Minutes per session: Not on file  . Stress: Not on file  Relationships  . Social Musician on phone: Not on file    Gets together: Not on file    Attends religious service: Not on file    Active member of club or organization: Not on file    Attends meetings of clubs or organizations: Not on file    Relationship status: Not on file  . Intimate partner violence    Fear of current or ex partner: Not on file    Emotionally abused: Not on file    Physically abused: Not on file    Forced sexual activity: Not on file  Other Topics Concern  . Not on file  Social History Narrative  . Not on file    Past Medical History, Surgical history, Social history, and Family history were reviewed and updated as appropriate.   Please see review of systems for further details on the patient's review from today.   Objective:   Physical Exam:  There were no vitals taken for this visit.  Physical Exam Constitutional:      General: She is not in acute distress.    Appearance: She is well-developed.  Musculoskeletal:        General: No deformity.  Neurological:     Mental Status: She is alert and oriented to person, place, and time.     Cranial Nerves: No dysarthria.     Coordination: Coordination normal.  Psychiatric:        Attention and  Perception: Attention and perception normal. She does not perceive auditory or visual hallucinations.        Mood and Affect: Mood is anxious and depressed. Affect is not labile, blunt, angry or inappropriate.        Speech: Speech normal.        Behavior: Behavior normal.  Thought Content: Thought content normal. Thought content does not include homicidal or suicidal ideation. Thought content does not include homicidal or suicidal plan.        Cognition and Memory: Cognition and memory normal.        Judgment: Judgment normal.     Comments: Insight intact. No delusions.  She feels that the depression and anxiety are situational and appropriate to the circumstances and not interfering with function     Lab Review:     Component Value Date/Time   NA 141 12/31/2014 0904   K 3.6 12/31/2014 0904   CL 110 12/31/2014 0904   CO2 23 12/31/2014 0904   GLUCOSE 107 (H) 12/31/2014 0904   BUN 10 12/31/2014 0904   CREATININE 0.80 12/31/2014 0904   CALCIUM 8.4 12/31/2014 0904   PROT 5.6 (L) 12/31/2014 0904   ALBUMIN 2.7 (L) 12/31/2014 0904   AST 27 12/31/2014 0904   ALT 21 12/31/2014 0904   ALKPHOS 61 12/31/2014 0904   BILITOT 0.1 (L) 12/31/2014 0904   GFRNONAA 83 (L) 12/31/2014 0904   GFRAA >90 12/31/2014 0904       Component Value Date/Time   WBC 6.0 12/31/2014 0904   RBC 3.57 (L) 12/31/2014 0904   HGB 11.0 (L) 12/31/2014 0904   HCT 34.4 (L) 12/31/2014 0904   PLT 317 12/31/2014 0904   MCV 96.4 12/31/2014 0904   MCH 30.8 12/31/2014 0904   MCHC 32.0 12/31/2014 0904   RDW 13.9 12/31/2014 0904   LYMPHSABS 1.3 12/31/2014 0904   MONOABS 0.4 12/31/2014 0904   EOSABS 1.0 (H) 12/31/2014 0904   BASOSABS 0.1 12/31/2014 0904    No results found for: POCLITH, LITHIUM   No results found for: PHENYTOIN, PHENOBARB, VALPROATE, CBMZ   .res Assessment: Plan:    Major depressive disorder, recurrent episode, moderate (HCC)  History of posttraumatic stress disorder (PTSD)  History of  anorexia nervosa  Alcohol dependence in remission ([place])  Insomnia due to mental condition   Keila has a long history of multiple psychiatric problems as noted.  They intertwine and affect 1 another.  Fortunately currently she is sober.  Her anorexia makes her resistant to taking psychiatric medications that cause any risk of weight gain.  This greatly limits other antidepressant options though she could likely benefit from additional antidepressants.  I am hesitant to increase the Wellbutrin out of fear that it may further suppress her eating.  Work on Publishing rights manager.  Chronically sleep deprived.  Get more sleep.  This contributes to her psychiatric instability.  She says she will work on it.  Emphasized eating enough protein.  No further weight loss.  Discussed eating disorder issues.  Hates SSRI.  Offered low dosage to help with the depression.  She refuses.  She doesn't want more antidepressant right now.  Functioning OK.  Promises to call if gets worse.She feels that the depression and anxiety are situational and appropriate to the circumstances and not interfering with function  Supportive therapy dealing with the loss of the job.  Maintain sobriety.  Make use of AA as you have done in the past  Trial increase Wellbutrin Xl 450 mg for depression.  Disc SE.[name]  This appt was 30 mins.  Rec counseling ASAP.  She agrees  Call if the depression worsens or suicidal thoughts return.  She agrees  Fu 6 mos  Lynder Parents, MD, DFAPA    Please see After Visit Summary for patient specific instructions.  No future appointments.  No  orders of the defined types were placed in this encounter.     -------------------------------
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
## 2019-08-09 ENCOUNTER — Other Ambulatory Visit: Payer: Self-pay | Admitting: Psychiatry

## 2019-09-08 ENCOUNTER — Other Ambulatory Visit: Payer: Self-pay | Admitting: Psychiatry

## 2019-09-19 ENCOUNTER — Other Ambulatory Visit: Payer: Self-pay | Admitting: Psychiatry

## 2019-11-14 ENCOUNTER — Other Ambulatory Visit: Payer: Self-pay | Admitting: Psychiatry

## 2019-12-12 ENCOUNTER — Other Ambulatory Visit: Payer: Self-pay | Admitting: Psychiatry

## 2020-01-09 ENCOUNTER — Other Ambulatory Visit: Payer: Self-pay | Admitting: Psychiatry

## 2020-03-27 ENCOUNTER — Other Ambulatory Visit: Payer: Self-pay | Admitting: Psychiatry

## 2020-03-27 NOTE — Telephone Encounter (Signed)
Call to RS

## 2020-03-27 NOTE — Telephone Encounter (Signed)
Last apt 06/2019, was due back 6 months

## 2020-03-29 NOTE — Telephone Encounter (Signed)
Pt made appt for 8/5. Next avail. appt that she could do. Needs to get her Clonodine refilled, please.

## 2020-04-16 ENCOUNTER — Other Ambulatory Visit: Payer: Self-pay | Admitting: Psychiatry

## 2020-05-07 ENCOUNTER — Other Ambulatory Visit: Payer: Self-pay | Admitting: Psychiatry

## 2020-05-24 ENCOUNTER — Ambulatory Visit: Payer: Self-pay | Admitting: Psychiatry

## 2020-06-11 ENCOUNTER — Other Ambulatory Visit: Payer: Self-pay | Admitting: Psychiatry

## 2020-07-23 ENCOUNTER — Telehealth (INDEPENDENT_AMBULATORY_CARE_PROVIDER_SITE_OTHER): Payer: Self-pay | Admitting: Psychiatry

## 2020-07-23 ENCOUNTER — Encounter: Payer: Self-pay | Admitting: Psychiatry

## 2020-07-23 DIAGNOSIS — F5105 Insomnia due to other mental disorder: Secondary | ICD-10-CM

## 2020-07-23 DIAGNOSIS — F1021 Alcohol dependence, in remission: Secondary | ICD-10-CM

## 2020-07-23 DIAGNOSIS — F4310 Post-traumatic stress disorder, unspecified: Secondary | ICD-10-CM

## 2020-07-23 DIAGNOSIS — Z8659 Personal history of other mental and behavioral disorders: Secondary | ICD-10-CM

## 2020-07-23 DIAGNOSIS — Z599 Problem related to housing and economic circumstances, unspecified: Secondary | ICD-10-CM

## 2020-07-23 DIAGNOSIS — F331 Major depressive disorder, recurrent, moderate: Secondary | ICD-10-CM

## 2020-07-23 MED ORDER — ARIPIPRAZOLE 2 MG PO TABS: 2.0000 mg | ORAL_TABLET | Freq: Every day | ORAL | 1 refills | Status: DC

## 2020-07-23 NOTE — Progress Notes (Signed)
[name] [name] [id number] 07/21/1961 59 y.o.  Virtual Visit via Telephone Note  I connected with pt by telephone and verified that I am speaking with the correct person using two identifiers.   I discussed the limitations, risks, security and privacy concerns of performing an evaluation and management service by telephone and the availability of in person appointments. I also discussed with the patient that there may be a patient responsible charge related to this service. The patient expressed understanding and agreed to proceed.  I discussed the assessment and treatment plan with the patient. The patient was provided an opportunity to ask questions and all were answered. The patient agreed with the plan and demonstrated an understanding of the instructions.   The patient was advised to call back or seek an in-person evaluation if the symptoms worsen or if the condition fails to improve as anticipated.  I provided 15 minutes of non-face-to-face time during this encounter. The call started at 245 and ended at 3:00. The patient was located at work and the provider was located office.  Subjective:   Patient ID:  [name] [name] is a 59 y.o. (DOB 23-Nov-1960) female.  Chief Complaint:  Chief Complaint  Patient presents with  . Follow-up    Medication Management  . Depression    Medication Management  . Anxiety    Depression        Associated symptoms include no decreased concentration and no suicidal ideas.  Loralee Weitzman presents to the office today for follow-up of several psychiatric diagnoses  seen January 12, 2019.[name]  No medications were changed.  06/2019 appt:  Fired from [employer] after [employer] of the month.  Doesn't know why and doesn't understand it.  Stressed by this.  Was falsely accused of making false statements on the Covid test and yet she didn't do it. Caused her to feel more depressed.   Filed for unemployment.  Had an interview for [employer] and has a history with them.  Went well.   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   Fine with meds.  Consistent.  Sleep problems related to the stress but getting better with some lethargy.  Low energy and motivation and reduced concentration.  Had some of it before this job loss.  Wants to try increase Wellbutrin No alcohol relapses but definitely more stressed and angry.  Wanting to withdraw.   No diet cokes.  3 cups coffee. Plan: Trial increase Wellbutrin Xl 450 mg for depression.    07/23/2020 appointment with the following noted: Too anxious at 450 mg Wellbutrin. Overall not well at all for several months.  Poor jobs, working at [employer] just to get by.  Depressed for months.  Hasn't been able to get a good job and just gave up hopeless and poor selef care for awhile.  Came out of it a little with visits from kids.  I hurt constantly in back and in her heart.  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  Falsely accused of things like not wearing PPE.    Moved to [place].  Being to be outside is good for her mental health. Runs. Single care app and [employer] helped med costs.  Past Psychiatric Medication Trials:  Prozac flat, Lexapro, Zoloft,  Wellbutrin XL 450 mg SE topiramate, trazodone hangover,  Review of Systems:  Review of Systems  Musculoskeletal: Negative for gait problem.  Neurological: Negative for dizziness, tremors and speech  difficulty.  Psychiatric/Behavioral: Positive for depression and dysphoric mood. Negative for agitation, behavioral problems, confusion, decreased concentration and suicidal ideas. The patient is nervous/anxious.        Please refer to HPI    Medications: I have reviewed the patient's current medications.  Current Outpatient Medications  Medication Sig Dispense Refill  . buPROPion (WELLBUTRIN XL) 300 MG 24 hr tablet TAKE 1 TABLET BY MOUTH ONCE DAILY IN THE MORNING 90 tablet 0  .  celecoxib (CELEBREX) 200 MG capsule Take 200 mg by mouth.    . cloNIDine (CATAPRES) 0.1 MG tablet TAKE 1/2 (ONE-HALF) TABLET BY MOUTH IN THE MORNING AND 1 AT BEDTIME 45 tablet 1  . Multiple Vitamins-Minerals (WOMENS 50+ MULTI VITAMIN/MIN PO) Take by mouth.    . pramipexole (MIRAPEX) 0.125 MG tablet Take 0.375 mg by mouth at bedtime.   1  . pregabalin (LYRICA) 150 MG capsule Take 150 mg by mouth at bedtime.    . topiramate (TOPAMAX) 50 MG tablet Take 1 tablet by mouth twice daily 60 tablet 5  . triamterene-hydrochlorothiazide (MAXZIDE-25) 37.5-25 MG tablet TAKE 1/2 (ONE HALF) TABLET EVERY MORNING AS NEEDED FOR FLUID RETENTION    . ARIPiprazole (ABILIFY) 2 MG tablet Take 1 tablet (2 mg total) by mouth daily. 30 tablet 1   No current facility-administered medications for this visit.    Medication Side Effects: None  Allergies:  Allergies  Allergen Reactions  . Sulfa Antibiotics Hives    Hives & edema  . Erythromycin Other (See Comments)    Flu-like symptoms    Past Medical History:  Diagnosis Date  . Alcohol abuse   . Allergy   . Anemia   . Anorexia   . Anxiety   . Asthma   . Back pain   . Complication of anesthesia   . Depression   . Pancreatitis   . PONV (postoperative nausea and vomiting)   . Sciatica     Family History  Problem Relation Age of Onset  . Heart disease Mother   . Mental illness Maternal Grandmother   . Drug abuse Maternal Grandmother   . Cancer Maternal Grandfather   . Cancer Paternal Grandmother   . Cancer Paternal Grandfather   . Drug abuse Paternal Grandfather     Social History   Socioeconomic History  . Marital status: Married    Spouse name: Not on file  . Number of children: Not on file  . Years of education: Not on file  . Highest education level: Not on file  Occupational History  . Not on file  Tobacco Use  . Smoking status: Current Every Day Smoker    Packs/day: 0.25    Years: 5.00    Pack years: 1.25    Types: Cigarettes  .  Smokeless tobacco: Never Used  Substance and Sexual Activity  . Alcohol use: Yes    Comment: 1.5 fifth QD, last drink 10/29/11  . Drug use: No  . Sexual activity: Not Currently    Birth control/protection: I.U.D.  Other Topics Concern  . Not on file  Social History Narrative  . Not on file   Social Determinants of Health   Financial Resource Strain:   . Difficulty of Paying Living Expenses: Not on file  Food Insecurity:   . Worried About Programme researcher, broadcasting/film/video in the Last Year: Not on file  . Ran Out of Food in the Last Year: Not on file  Transportation Needs:   . Lack of Transportation (Medical): Not on  file  . Lack of Transportation (Non-Medical): Not on file  Physical Activity:   . Days of Exercise per Week: Not on file  . Minutes of Exercise per Session: Not on file  Stress:   . Feeling of Stress : Not on file  Social Connections:   . Frequency of Communication with Friends and Family: Not on file  . Frequency of Social Gatherings with Friends and Family: Not on file  . Attends Religious Services: Not on file  . Active Member of Clubs or Organizations: Not on file  . Attends Banker Meetings: Not on file  . Marital Status: Not on file  Intimate Partner Violence:   . Fear of Current or Ex-Partner: Not on file  . Emotionally Abused: Not on file  . Physically Abused: Not on file  . Sexually Abused: Not on file    Past Medical History, Surgical history, Social history, and Family history were reviewed and updated as appropriate.   Please see review of systems for further details on the patient's review from today.   Objective:   Physical Exam:  There were no vitals taken for this visit.  Physical Exam Constitutional:      General: She is not in acute distress.    Appearance: She is well-developed.  Musculoskeletal:        General: No deformity.  Neurological:     Mental Status: She is alert and oriented to person, place, and time.     Cranial Nerves:  No dysarthria.     Coordination: Coordination normal.  Psychiatric:        Attention and Perception: Attention and perception normal. She does not perceive auditory or visual hallucinations.        Mood and Affect: Mood is anxious and depressed. Affect is not labile, blunt, angry or inappropriate.        Speech: Speech normal.        Behavior: Behavior normal.        Thought Content: Thought content normal. Thought content does not include homicidal or suicidal ideation. Thought content does not include homicidal or suicidal plan.        Cognition and Memory: Cognition and memory normal.        Judgment: Judgment normal.     Comments: Insight intact. No delusions.  She feels that the depression and anxiety are worse. Some death thoughts without SI     Lab Review:     Component Value Date/Time   NA 141 12/31/2014 0904   K 3.6 12/31/2014 0904   CL 110 12/31/2014 0904   CO2 23 12/31/2014 0904   GLUCOSE 107 (H) 12/31/2014 0904   BUN 10 12/31/2014 0904   CREATININE 0.80 12/31/2014 0904   CALCIUM 8.4 12/31/2014 0904   PROT 5.6 (L) 12/31/2014 0904   ALBUMIN 2.7 (L) 12/31/2014 0904   AST 27 12/31/2014 0904   ALT 21 12/31/2014 0904   ALKPHOS 61 12/31/2014 0904   BILITOT 0.1 (L) 12/31/2014 0904   GFRNONAA 83 (L) 12/31/2014 0904   GFRAA >90 12/31/2014 0904       Component Value Date/Time   WBC 6.0 12/31/2014 0904   RBC 3.57 (L) 12/31/2014 0904   HGB 11.0 (L) 12/31/2014 0904   HCT 34.4 (L) 12/31/2014 0904   PLT 317 12/31/2014 0904   MCV 96.4 12/31/2014 0904   MCH 30.8 12/31/2014 0904   MCHC 32.0 12/31/2014 0904   RDW 13.9 12/31/2014 0904   LYMPHSABS 1.3 12/31/2014 5956  MONOABS 0.4 12/31/2014 0904   EOSABS 1.0 (H) 12/31/2014 0904   BASOSABS 0.1 12/31/2014 0904    No results found for: POCLITH, LITHIUM   No results found for: PHENYTOIN, PHENOBARB, VALPROATE, CBMZ   .res Assessment: Plan:    Major depressive disorder, recurrent episode, moderate (HCC) - Plan:  ARIPiprazole (ABILIFY) 2 MG tablet  PTSD (post-traumatic stress disorder) - Plan: ARIPiprazole (ABILIFY) 2 MG tablet  Alcohol dependence in remission (HCC)  History of anorexia nervosa  Financial problems  Insomnia due to mental condition   Lanora Manislizabeth has a long history of multiple psychiatric problems as noted.  They intertwine and affect 1 another.  Fortunately currently she is sober.  Her anorexia makes her resistant to taking psychiatric medications that cause any risk of weight gain.  This greatly limits other antidepressant options though she could likely benefit from additional antidepressants.  Increase Wellbutrin 450 mg not tolerated..  Work on sleep hygiene.  Chronically sleep deprived.  Get more sleep.  This contributes to her psychiatric instability.  She says she will work on it.  Emphasized eating enough protein.  No further weight loss.  Discussed eating disorder issues.  Hates SSRI.  Offered low dosage to help with the depression.  She refuses.  She doesn't want more antidepressant right now.  Functioning OK.  Promises to call if gets worse.She feels that the depression and anxiety are situational and appropriate to the circumstances and not interfering with function  Supportive therapy dealing with the loss of the job.  Maintain sobriety.  Make use of AA as you have done in the past  Abilify 2 mg potentiation.    This appt was 30 mins.  Rec counseling ASAP.  She agrees  Call if the depression worsens or suicidal thoughts return.  She agrees  Fu 6 weeks  Meredith Staggersarey Cottle, MD, DFAPA    Please see After Visit Summary for patient specific instructions.  No future appointments.  No orders of the defined types were placed in this encounter.     -------------------------------

## 2020-07-24 ENCOUNTER — Other Ambulatory Visit: Payer: Self-pay | Admitting: Psychiatry

## 2020-07-24 NOTE — Telephone Encounter (Signed)
Please review

## 2020-09-04 ENCOUNTER — Other Ambulatory Visit: Payer: Self-pay | Admitting: Psychiatry

## 2020-11-28 ENCOUNTER — Other Ambulatory Visit: Payer: Self-pay | Admitting: Psychiatry

## 2021-01-08 ENCOUNTER — Other Ambulatory Visit: Payer: Self-pay | Admitting: Psychiatry

## 2021-02-15 ENCOUNTER — Other Ambulatory Visit: Payer: Self-pay | Admitting: Psychiatry

## 2021-02-21 ENCOUNTER — Telehealth: Payer: Self-pay | Admitting: Psychiatry

## 2021-02-21 NOTE — Telephone Encounter (Signed)
Spoke to Pharmacy and clarified

## 2021-02-21 NOTE — Telephone Encounter (Signed)
The pharmacy left a message asking for clarification on the directions on the clonidine. Please call to answer questions. Please call at [phone]

## 2021-03-06 ENCOUNTER — Encounter: Payer: Self-pay | Admitting: Psychiatry

## 2021-03-06 NOTE — Progress Notes (Signed)
This encounter was created in error - please disregard.

## 2021-04-11 ENCOUNTER — Other Ambulatory Visit: Payer: Self-pay | Admitting: Psychiatry

## 2021-04-22 ENCOUNTER — Other Ambulatory Visit: Payer: Self-pay | Admitting: Psychiatry

## 2021-05-20 ENCOUNTER — Other Ambulatory Visit: Payer: Self-pay | Admitting: Psychiatry

## 2021-06-05 ENCOUNTER — Telehealth (INDEPENDENT_AMBULATORY_CARE_PROVIDER_SITE_OTHER): Payer: Self-pay | Admitting: Psychiatry

## 2021-06-05 ENCOUNTER — Encounter: Payer: Self-pay | Admitting: Psychiatry

## 2021-06-05 DIAGNOSIS — Z8659 Personal history of other mental and behavioral disorders: Secondary | ICD-10-CM

## 2021-06-05 DIAGNOSIS — F331 Major depressive disorder, recurrent, moderate: Secondary | ICD-10-CM

## 2021-06-05 DIAGNOSIS — F4310 Post-traumatic stress disorder, unspecified: Secondary | ICD-10-CM

## 2021-06-05 DIAGNOSIS — F1021 Alcohol dependence, in remission: Secondary | ICD-10-CM

## 2021-06-05 DIAGNOSIS — F5105 Insomnia due to other mental disorder: Secondary | ICD-10-CM

## 2021-06-05 NOTE — Progress Notes (Signed)
[name] [name] [id number] [birth year] 60 y.o.  Virtual Visit via Telephone Note  I connected with pt by telephone and verified that I am speaking with the correct person using two identifiers.   I discussed the limitations, risks, security and privacy concerns of performing an evaluation and management service by telephone and the availability of in person appointments. I also discussed with the patient that there may be a patient responsible charge related to this service. The patient expressed understanding and agreed to proceed.  I discussed the assessment and treatment plan with the patient. The patient was provided an opportunity to ask questions and all were answered. The patient agreed with the plan and demonstrated an understanding of the instructions.   The patient was advised to call back or seek an in-person evaluation if the symptoms worsen or if the condition fails to improve as anticipated.  I provided 15 minutes of non-face-to-face time during this encounter.  The patient was located at work and the provider was located office.  Session started 315 345 PM  Subjective:   Patient ID:  [name] [name] is a 60 y.o. (DOB April 19, 1961) female.  Chief Complaint:  Chief Complaint  Patient presents with   Follow-up   Depression   Anxiety    Depression        Associated symptoms include no decreased concentration and no suicidal ideas. [name] presents to the office today for follow-up of several psychiatric diagnoses  seen January 12, 2019.[name]  No medications were changed.  06/2019 appt:  Fired from [employer] after [employer] of the month.  Doesn't know why and doesn't understand it.  Stressed by this.  Was falsely accused of making false statements on the Covid test and yet she didn't do it. Caused her to feel more depressed.   Filed for unemployment.  Had an interview for [employer] and has a history with them.  Went well.   [employer]  hit hard by [employer].   Fine with meds.  Consistent.  Sleep problems related to the stress but getting better with some lethargy.  Low energy and motivation and reduced concentration.  Had some of it before this job loss.  Wants to try increase Wellbutrin No alcohol relapses but definitely more stressed and angry.  Wanting to withdraw.   No diet cokes.  3 cups coffee. Plan: Trial increase Wellbutrin Xl 450 mg for depression.    07/23/2020 appointment with the following noted: Too anxious at 450 mg Wellbutrin. Overall not well at all for several months.  Poor jobs, working at [employer] just to get by.  Depressed for months.  Hasn't been able to get a good job and just gave up hopeless and poor selef care for awhile.  Came out of it a little with visits from kids.  I hurt constantly in back and in her heart.  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  Falsely accused of things like not wearing PPE.   Plan: Abilify 2 mg potentiation.    06/05/21 appt with following noted: Abilify caused insomnia but helped depression and took about 3 mos and then stopped. [place] but getting some better.  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 Also got a therapist which is hlpeing too.  More hopeful for first time in a long while.  Doing positive things.  Still struggle with sleep but better than it was with problems staying asleep Anxiety  not too bad but still struggles with depression.[name].  Moved to [place].  Being to be outside is good for her mental health. Runs. Single care app and [employer] helped med costs.  Past Psychiatric Medication Trials:  Prozac flat, Lexapro, Zoloft,   Wellbutrin XL 450 mg SE Abilify 2 insomnia topiramate, trazodone hangover, OTC NR  Review of Systems:  Review of Systems  Musculoskeletal:  Negative for gait problem.  Neurological:  Negative for dizziness, tremors and speech difficulty.  Psychiatric/Behavioral:   Positive for dysphoric mood. Negative for agitation, behavioral problems, confusion, decreased concentration and suicidal ideas. The patient is not nervous/anxious.        Please refer to HPI   Medications: I have reviewed the patient's current medications.  Current Outpatient Medications  Medication Sig Dispense Refill   buPROPion (WELLBUTRIN XL) 300 MG 24 hr tablet TAKE 1 TABLET BY MOUTH ONCE DAILY IN THE MORNING 90 tablet 0   celecoxib (CELEBREX) 200 MG capsule Take 200 mg by mouth.     cloNIDine (CATAPRES) 0.1 MG tablet TAKE 1/2 (ONE-HALF) TABLET BY MOUTH IN THE MORNING AND 1 AT BEDTIME (Patient taking differently: 1 AT BEDTIME) 45 tablet 0   Multiple Vitamins-Minerals (WOMENS 50+ MULTI VITAMIN/MIN PO) Take by mouth.     pramipexole (MIRAPEX) 0.125 MG tablet Take 0.375 mg by mouth at bedtime.   1   pregabalin (LYRICA) 150 MG capsule Take 150 mg by mouth at bedtime.     triamterene-hydrochlorothiazide (MAXZIDE-25) 37.5-25 MG tablet TAKE 1/2 (ONE HALF) TABLET EVERY MORNING AS NEEDED FOR FLUID RETENTION     ARIPiprazole (ABILIFY) 2 MG tablet Take 1 tablet (2 mg total) by mouth daily. (Patient not taking: Reported on 06/05/2021) 30 tablet 1   topiramate (TOPAMAX) 50 MG tablet Take 1 tablet by mouth twice daily (Patient not taking: Reported on 06/05/2021) 60 tablet 5   No current facility-administered medications for this visit.    Medication Side Effects: None  Allergies:  Allergies  Allergen Reactions   Sulfa Antibiotics Hives    Hives & edema   Erythromycin Other (See Comments)    Flu-like symptoms    Past Medical History:  Diagnosis Date   Alcohol abuse    Allergy    Anemia    Anorexia    Anxiety    Asthma    Back pain    Complication of anesthesia    Depression    Pancreatitis    PONV (postoperative nausea and vomiting)    Sciatica     Family History  Problem Relation Age of Onset   Heart disease Mother    Mental illness Maternal Grandmother    Drug abuse Maternal  Grandmother    Cancer Maternal Grandfather    Cancer Paternal Grandmother    Cancer Paternal Grandfather    Drug abuse Paternal Grandfather     Social History   Socioeconomic History   Marital status: Married    Spouse name: Not on file   Number of children: Not on file   Years of education: Not on file   Highest education level: Not on file  Occupational History   Not on file  Tobacco Use   Smoking status: Every Day    Packs/day: 0.25    Years: 5.00    Pack years: 1.25    Types: Cigarettes   Smokeless tobacco: Never  Substance and Sexual Activity   Alcohol use: Yes    Comment: 1.5 fifth QD, last drink 10/29/11   Drug use: No  Sexual activity: Not Currently    Birth control/protection: I.U.D.  Other Topics Concern   Not on file  Social History Narrative   Not on file   Social Determinants of Health   Financial Resource Strain: Not on file  Food Insecurity: Not on file  Transportation Needs: Not on file  Physical Activity: Not on file  Stress: Not on file  Social Connections: Not on file  Intimate Partner Violence: Not on file    Past Medical History, Surgical history, Social history, and Family history were reviewed and updated as appropriate.   Please see review of systems for further details on the patient's review from today.   Objective:   Physical Exam:  There were no vitals taken for this visit.  Physical Exam Constitutional:      General: She is not in acute distress.    Appearance: She is well-developed.  Musculoskeletal:        General: No deformity.  Neurological:     Mental Status: She is alert and oriented to person, place, and time.     Cranial Nerves: No dysarthria.     Coordination: Coordination normal.  Psychiatric:        Attention and Perception: Attention and perception normal. She does not perceive auditory or visual hallucinations.        Mood and Affect: Mood is depressed. Mood is not anxious. Affect is not labile, blunt, angry  or inappropriate.        Speech: Speech normal.        Behavior: Behavior normal.        Thought Content: Thought content normal. Thought content does not include homicidal or suicidal ideation. Thought content does not include homicidal or suicidal plan.        Cognition and Memory: Cognition and memory normal.        Judgment: Judgment normal.     Comments: Insight intact. No delusions.  She feels that the depression still a problem but anxiety is OK    Lab Review:     Component Value Date/Time   NA 141 12/31/2014 0904   K 3.6 12/31/2014 0904   CL 110 12/31/2014 0904   CO2 23 12/31/2014 0904   GLUCOSE 107 (H) 12/31/2014 0904   BUN 10 12/31/2014 0904   CREATININE 0.80 12/31/2014 0904   CALCIUM 8.4 12/31/2014 0904   PROT 5.6 (L) 12/31/2014 0904   ALBUMIN 2.7 (L) 12/31/2014 0904   AST 27 12/31/2014 0904   ALT 21 12/31/2014 0904   ALKPHOS 61 12/31/2014 0904   BILITOT 0.1 (L) 12/31/2014 0904   GFRNONAA 83 (L) 12/31/2014 0904   GFRAA >90 12/31/2014 0904       Component Value Date/Time   WBC 6.0 12/31/2014 0904   RBC 3.57 (L) 12/31/2014 0904   HGB 11.0 (L) 12/31/2014 0904   HCT 34.4 (L) 12/31/2014 0904   PLT 317 12/31/2014 0904   MCV 96.4 12/31/2014 0904   MCH 30.8 12/31/2014 0904   MCHC 32.0 12/31/2014 0904   RDW 13.9 12/31/2014 0904   LYMPHSABS 1.3 12/31/2014 0904   MONOABS 0.4 12/31/2014 0904   EOSABS 1.0 (H) 12/31/2014 0904   BASOSABS 0.1 12/31/2014 0904    No results found for: POCLITH, LITHIUM   No results found for: PHENYTOIN, PHENOBARB, VALPROATE, CBMZ   .res Assessment: Plan:    Major depressive disorder, recurrent episode, moderate (HCC)  PTSD (post-traumatic stress disorder)  Alcohol dependence in remission (HCC)  History of anorexia nervosa  Insomnia due  to mental condition   Raneisha has a long history of multiple psychiatric problems as noted.  They intertwine and affect 1 another.  Fortunately currently she is sober.  Her anorexia makes her  resistant to taking psychiatric medications that cause any risk of weight gain.  This greatly limits other antidepressant options though she could likely benefit from additional antidepressants.  Increase Wellbutrin 450 mg not tolerated..  Work on sleep hygiene.  Chronically sleep deprived.  Get more sleep.  This contributes to her psychiatric instability.  She says she will work on it.  Emphasized eating enough protein.  No further weight loss.  Discussed eating disorder issues.  Hates SSRI.  Offered low dosage to help with the depression.  She refuses.  She doesn't want more antidepressant right now.  Functioning OK.  Promises to call if gets worse.She feels that the depression and anxiety are situational and appropriate to the circumstances and not interfering with function  Supportive therapy dealing with the job problems but she is working now..  Maintain sobriety.  Make use of AA as you have done in the past  Rec Tr[employer] trial.  She has no insurance so will check into whether she qualifies for pt assistance. Other option [employer]. Given her distance from office, getting samples is not practical.  This appt was 30 mins.  Rec counseling ASAP.  She agrees  Call if the depression worsens or suicidal thoughts return.  She agrees  Fu 6 weeks  Meredith Staggers, MD, DFAPA    Please see After Visit Summary for patient specific instructions.  No future appointments.   No orders of the defined types were placed in this encounter.     -------------------------------

## 2021-06-25 ENCOUNTER — Other Ambulatory Visit: Payer: Self-pay | Admitting: Psychiatry

## 2021-08-07 ENCOUNTER — Other Ambulatory Visit: Payer: Self-pay | Admitting: Psychiatry

## 2021-08-07 NOTE — Telephone Encounter (Signed)
Please schedule appt

## 2021-08-08 NOTE — Telephone Encounter (Signed)
Left message for pt to call and schedule

## 2021-08-09 ENCOUNTER — Other Ambulatory Visit: Payer: Self-pay | Admitting: Psychiatry

## 2021-08-12 NOTE — Telephone Encounter (Signed)
Please schedule appt

## 2021-08-12 NOTE — Telephone Encounter (Signed)
Pt LM on VM requesting refill for Wellbutrin and Clonidine to [facility].

## 2021-08-13 NOTE — Telephone Encounter (Signed)
Called and LVM

## 2021-08-13 NOTE — Telephone Encounter (Signed)
Appt 1/3 and on the wait list

## 2021-10-22 ENCOUNTER — Telehealth: Payer: Self-pay | Admitting: Psychiatry

## 2021-12-05 ENCOUNTER — Encounter: Payer: Self-pay | Admitting: Psychiatry

## 2021-12-14 NOTE — Progress Notes (Signed)
Could not reach pt for televisit This encounter was created in error - please disregard. 

## 2021-12-16 ENCOUNTER — Other Ambulatory Visit: Payer: Self-pay | Admitting: Psychiatry

## 2021-12-16 NOTE — Telephone Encounter (Signed)
Appt tomorrow.

## 2021-12-17 ENCOUNTER — Encounter: Payer: Self-pay | Admitting: Psychiatry

## 2021-12-17 ENCOUNTER — Ambulatory Visit (INDEPENDENT_AMBULATORY_CARE_PROVIDER_SITE_OTHER): Payer: Self-pay | Admitting: Psychiatry

## 2021-12-17 DIAGNOSIS — F1021 Alcohol dependence, in remission: Secondary | ICD-10-CM

## 2021-12-17 DIAGNOSIS — F4001 Agoraphobia with panic disorder: Secondary | ICD-10-CM

## 2021-12-17 DIAGNOSIS — Z8659 Personal history of other mental and behavioral disorders: Secondary | ICD-10-CM

## 2021-12-17 DIAGNOSIS — F331 Major depressive disorder, recurrent, moderate: Secondary | ICD-10-CM

## 2021-12-17 DIAGNOSIS — F4310 Post-traumatic stress disorder, unspecified: Secondary | ICD-10-CM

## 2021-12-17 MED ORDER — BUPROPION HCL ER (XL) 300 MG PO TB24: 300.0000 mg | ORAL_TABLET | Freq: Every morning | ORAL | 0 refills | Status: DC

## 2021-12-17 MED ORDER — CLONIDINE HCL 0.1 MG PO TABS: 0.1000 mg | ORAL_TABLET | Freq: Two times a day (BID) | ORAL | 3 refills | Status: DC

## 2021-12-17 MED ORDER — TOPIRAMATE 50 MG PO TABS: 50.0000 mg | ORAL_TABLET | Freq: Two times a day (BID) | ORAL | 5 refills | Status: DC

## 2021-12-17 NOTE — Progress Notes (Signed)
[name] [name] [id number] [birth year] 61 y.o.  Virtual Visit via Telephone Note  I connected with pt by telephone and verified that I am speaking with the correct person using two identifiers.   I discussed the limitations, risks, security and privacy concerns of performing an evaluation and management service by telephone and the availability of in person appointments. I also discussed with the patient that there may be a patient responsible charge related to this service. The patient expressed understanding and agreed to proceed.  I discussed the assessment and treatment plan with the patient. The patient was provided an opportunity to ask questions and all were answered. The patient agreed with the plan and demonstrated an understanding of the instructions.   The patient was advised to call back or seek an in-person evaluation if the symptoms worsen or if the condition fails to improve as anticipated.  I provided 15 minutes of non-face-to-face time during this encounter.  The patient was located at work and the provider was located office.  Session started 1130-1150  Subjective:   Patient ID:  [name] [name] is a 61 y.o. (DOB [birth year]) female.  Chief Complaint:  Chief Complaint  Patient presents with   Follow-up   Depression    Depression        Associated symptoms include no decreased concentration and no suicidal ideas. Samaree Kinner presents to the office today for follow-up of several psychiatric diagnoses  seen January 12, 2019.[name]  No medications were changed.  06/2019 appt:  Fired from [employer] after [employer] of the month.  Doesn't know why and doesn't understand it.  Stressed by this.  Was falsely accused of making false statements on the Covid test and yet she didn't do it. Caused her to feel more depressed.   Filed for unemployment.  Had an interview for [employer] and has a history with them.  Went well.   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   Fine with meds.  Consistent.  Sleep problems related to the stress but getting better with some lethargy.  Low energy and motivation and reduced concentration.  Had some of it before this job loss.  Wants to try increase Wellbutrin No alcohol relapses but definitely more stressed and angry.  Wanting to withdraw.   No diet cokes.  3 cups coffee. Plan: Trial increase Wellbutrin Xl 450 mg for depression.    07/23/2020 appointment with the following noted: Too anxious at 450 mg Wellbutrin. Overall not well at all for several months.  Poor jobs, working at [employer] just to get by.  Depressed for months.  Hasn't been able to get a good job and just gave up hopeless and poor selef care for awhile.  Came out of it a little with visits from kids.  I hurt constantly in back and in her heart.  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  Falsely accused of things like not wearing PPE.   Plan: Abilify 2 mg potentiation.    06/05/21 appt with following noted: Abilify caused insomnia but helped depression and took about 3 mos and then stopped. [place] but getting some better.  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 Also got a therapist which is hlpeing too.  More hopeful for first time in a long while.  Doing positive things.  Still struggle with sleep but better than it was with problems staying asleep Anxiety not too bad but still  struggles with depression.. Plan: Rec Trintellix trial.  She has no insurance so will check into whether she qualifies for pt assistance. Other option [employer]. Given her distance from office, getting samples is not practical.  12/17/21 appt noted:  Really struggled with anxiety. 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  Being aroud people more helped.  Volunteer with homeless shelter and networking. M ill for awhile and lost a lot of weight and pt had to go to [place]. More panic  lately really bad  and not this bad for a long time.  Wants more control.  A couple per week and can be triggered by minor things.  Crying and sense of dread even in public, and feeling need to run away and SOB.  Thinks she needs to restart topomax bc it helped in the past. Initial and terminal insomnia' and only 4 hours of sleep Still some depression. No alcohol abuse.  Moved to [place].  Being to be outside is good for her mental health. Runs. Single care app and [employer] helped med costs.  Past Psychiatric Medication Trials:  Prozac flat, Lexapro, Zoloft,   Wellbutrin XL 450 mg SE Abilify 2 insomnia topiramate,  trazodone hangover, OTC NR Clonidine 0.1 mg BID some fatigue  Review of Systems:  Review of Systems  Musculoskeletal:  Positive for back pain. Negative for gait problem.  Neurological:  Negative for dizziness, tremors and speech difficulty.  Psychiatric/Behavioral:  Positive for dysphoric mood. Negative for agitation, behavioral problems, confusion, decreased concentration and suicidal ideas. The patient is nervous/anxious.        Please refer to HPI   Medications: I have reviewed the patient's current medications.  Current Outpatient Medications  Medication Sig Dispense Refill   buPROPion (WELLBUTRIN XL) 300 MG 24 hr tablet TAKE 1 TABLET BY MOUTH ONCE DAILY IN THE MORNING 90 tablet 0   celecoxib (CELEBREX) 200 MG capsule Take 200 mg by mouth.     cloNIDine (CATAPRES) 0.1 MG tablet TAKE 1/2 (ONE-HALF) TABLET BY MOUTH IN THE MORNING AND 1 AT BEDTIME 45 tablet 3   Multiple Vitamins-Minerals (WOMENS 50+ MULTI VITAMIN/MIN PO) Take by mouth.     pramipexole (MIRAPEX) 0.125 MG tablet Take 0.375 mg by mouth at bedtime. 3 tabs HS  1   pregabalin (LYRICA) 150 MG capsule Take 150 mg by mouth at bedtime.     triamterene-hydrochlorothiazide (MAXZIDE-25) 37.5-25 MG tablet TAKE 1/2 (ONE HALF) TABLET EVERY MORNING AS NEEDED FOR FLUID RETENTION     ARIPiprazole (ABILIFY) 2 MG tablet  Take 1 tablet (2 mg total) by mouth daily. (Patient not taking: Reported on 12/17/2021) 30 tablet 1   topiramate (TOPAMAX) 50 MG tablet Take 1 tablet by mouth twice daily (Patient not taking: Reported on 06/05/2021) 60 tablet 5   No current facility-administered medications for this visit.    Medication Side Effects: None  Allergies:  Allergies  Allergen Reactions   Sulfa Antibiotics Hives    Hives & edema   Erythromycin Other (See Comments)    Flu-like symptoms    Past Medical History:  Diagnosis Date   Alcohol abuse    Allergy    Anemia    Anorexia    Anxiety    Asthma    Back pain    Complication of anesthesia    Depression    Pancreatitis    PONV (postoperative nausea and vomiting)    Sciatica     Family History  Problem Relation Age  of Onset   Heart disease Mother    Mental illness Maternal Grandmother    Drug abuse Maternal Grandmother    Cancer Maternal Grandfather    Cancer Paternal Grandmother    Cancer Paternal Grandfather    Drug abuse Paternal Grandfather     Social History   Socioeconomic History   Marital status: Married    Spouse name: Not on file   Number of children: Not on file   Years of education: Not on file   Highest education level: Not on file  Occupational History   Not on file  Tobacco Use   Smoking status: Every Day    Packs/day: 0.25    Years: 5.00    Pack years: 1.25    Types: Cigarettes   Smokeless tobacco: Never  Substance and Sexual Activity   Alcohol use: Yes    Comment: 1.5 fifth QD, last drink 10/29/11   Drug use: No   Sexual activity: Not Currently    Birth control/protection: I.U.D.  Other Topics Concern   Not on file  Social History Narrative   Not on file   Social Determinants of Health   Financial Resource Strain: Not on file  Food Insecurity: Not on file  Transportation Needs: Not on file  Physical Activity: Not on file  Stress: Not on file  Social Connections: Not on file  Intimate Partner Violence:  Not on file    Past Medical History, Surgical history, Social history, and Family history were reviewed and updated as appropriate.   Please see review of systems for further details on the patient's review from today.   Objective:   Physical Exam:  There were no vitals taken for this visit.  Physical Exam Neurological:     Mental Status: She is alert and oriented to person, place, and time.     Cranial Nerves: No dysarthria.  Psychiatric:        Attention and Perception: Attention and perception normal.        Mood and Affect: Mood is anxious and depressed.        Speech: Speech normal.        Behavior: Behavior is cooperative.        Thought Content: Thought content normal. Thought content is not paranoid or delusional. Thought content does not include homicidal or suicidal ideation. Thought content does not include suicidal plan.        Cognition and Memory: Cognition and memory normal.        Judgment: Judgment normal.     Comments: Insight intact    Lab Review:     Component Value Date/Time   NA 141 12/31/2014 0904   K 3.6 12/31/2014 0904   CL 110 12/31/2014 0904   CO2 23 12/31/2014 0904   GLUCOSE 107 (H) 12/31/2014 0904   BUN 10 12/31/2014 0904   CREATININE 0.80 12/31/2014 0904   CALCIUM 8.4 12/31/2014 0904   PROT 5.6 (L) 12/31/2014 0904   ALBUMIN 2.7 (L) 12/31/2014 0904   AST 27 12/31/2014 0904   ALT 21 12/31/2014 0904   ALKPHOS 61 12/31/2014 0904   BILITOT 0.1 (L) 12/31/2014 0904   GFRNONAA 83 (L) 12/31/2014 0904   GFRAA >90 12/31/2014 0904       Component Value Date/Time   WBC 6.0 12/31/2014 0904   RBC 3.57 (L) 12/31/2014 0904   HGB 11.0 (L) 12/31/2014 0904   HCT 34.4 (L) 12/31/2014 0904   PLT 317 12/31/2014 0904   MCV 96.4 12/31/2014 0904  MCH 30.8 12/31/2014 0904   MCHC 32.0 12/31/2014 0904   RDW 13.9 12/31/2014 0904   LYMPHSABS 1.3 12/31/2014 0904   MONOABS 0.4 12/31/2014 0904   EOSABS 1.0 (H) 12/31/2014 0904   BASOSABS 0.1 12/31/2014 0904     No results found for: POCLITH, LITHIUM   No results found for: PHENYTOIN, PHENOBARB, VALPROATE, CBMZ   .res Assessment: Plan:    Panic disorder with agoraphobia  Major depressive disorder, recurrent episode, moderate (HCC)  PTSD (post-traumatic stress disorder)  History of anorexia nervosa  Alcohol dependence in remission [facility])   Kennisha has a long history of multiple psychiatric problems as noted.  They intertwine and affect 1 another.  Fortunately currently she is sober.  Her anorexia makes her resistant to taking psychiatric medications that cause any risk of weight gain.  This greatly limits other antidepressant options though she could likely benefit from additional antidepressants.  Increase Wellbutrin 450 mg not tolerated..  Work on sleep hygiene.  Chronically sleep deprived.  Get more sleep.  This contributes to her psychiatric instability.  She says she will work on it. Initial and terminal insomnia'  Hates SSRI.  Offered low dosage to help with the depression.  She refuses.  She doesn't want more antidepressant right now.  Functioning OK.  Promises to call if gets worse.She feels that the depression and anxiety are situational and appropriate to the circumstances and not interfering with function  Supportive therapy dealing with the job problems but she is working now..  Maintain sobriety.  Make use of AA as you have done in the past  Continue Wellbutrin 300  Restart topiramate off label for panic bc helped before and increase to 50 mg BID Hydroxyzine 10 mg 1-2 tablets at night prn for sleep  This appt was 30 mins.  Rec counseling ASAP.  She agrees  Fu 2-3 mos  Lynder Parents, MD, DFAPA    Please see After Visit Summary for patient specific instructions.  No future appointments.   No orders of the defined types were placed in this encounter.      -------------------------------

## 2021-12-20 ENCOUNTER — Other Ambulatory Visit: Payer: Self-pay

## 2021-12-20 ENCOUNTER — Telehealth: Payer: Self-pay | Admitting: Psychiatry

## 2021-12-20 MED ORDER — HYDROXYZINE HCL 10 MG PO TABS: ORAL_TABLET | ORAL | 0 refills | Status: DC

## 2021-12-20 NOTE — Telephone Encounter (Signed)
Marisue Ivan called because at your visit with her 12/17/21 you were going to send in a prescription for a sleep medication - Hydroxyzine.  But it was not received by the Pharmacy.  Please send in this new prescription to [facility] on [address] in [place], [place]. Appt 02/20/22 ?

## 2021-12-20 NOTE — Telephone Encounter (Signed)
Pended.

## 2022-01-22 ENCOUNTER — Other Ambulatory Visit: Payer: Self-pay | Admitting: Psychiatry

## 2022-02-20 ENCOUNTER — Encounter: Payer: Self-pay | Admitting: Psychiatry

## 2022-02-20 ENCOUNTER — Telehealth (INDEPENDENT_AMBULATORY_CARE_PROVIDER_SITE_OTHER): Payer: Self-pay | Admitting: Psychiatry

## 2022-02-20 DIAGNOSIS — F1021 Alcohol dependence, in remission: Secondary | ICD-10-CM

## 2022-02-20 DIAGNOSIS — Z8659 Personal history of other mental and behavioral disorders: Secondary | ICD-10-CM

## 2022-02-20 DIAGNOSIS — F4310 Post-traumatic stress disorder, unspecified: Secondary | ICD-10-CM

## 2022-02-20 DIAGNOSIS — F5105 Insomnia due to other mental disorder: Secondary | ICD-10-CM

## 2022-02-20 DIAGNOSIS — F4001 Agoraphobia with panic disorder: Secondary | ICD-10-CM

## 2022-02-20 DIAGNOSIS — F331 Major depressive disorder, recurrent, moderate: Secondary | ICD-10-CM

## 2022-02-20 MED ORDER — TOPIRAMATE 50 MG PO TABS: 50.0000 mg | ORAL_TABLET | Freq: Two times a day (BID) | ORAL | 5 refills | Status: DC

## 2022-02-20 MED ORDER — HYDROXYZINE HCL 10 MG PO TABS: ORAL_TABLET | ORAL | 5 refills | Status: DC

## 2022-02-20 MED ORDER — BUPROPION HCL ER (XL) 300 MG PO TB24: 300.0000 mg | ORAL_TABLET | Freq: Every morning | ORAL | 1 refills | Status: DC

## 2022-02-20 MED ORDER — CLONIDINE HCL 0.1 MG PO TABS: 0.1000 mg | ORAL_TABLET | Freq: Two times a day (BID) | ORAL | 5 refills | Status: DC

## 2022-02-20 NOTE — Progress Notes (Signed)
[name] [name] ?[id number] ?1960-12-23 ?61 y.o. ? ?Video Visit via My Chart ? ?I connected with pt by video using My Chart and verified that I am speaking with the correct person using two identifiers. ?  ?I discussed the limitations, risks, security and privacy concerns of performing an evaluation and management service by My Chart  and the availability of in person appointments. I also discussed with the patient that there may be a patient responsible charge related to this service. The patient expressed understanding and agreed to proceed. ? ?I discussed the assessment and treatment plan with the patient. The patient was provided an opportunity to ask questions and [name] were answered. The patient agreed with the plan and demonstrated an understanding of the instructions. ?  ?The patient was advised to call back or seek an in-person evaluation if the symptoms worsen or if the condition fails to improve as anticipated. ? ?I provided 30 minutes of video time during this encounter.  The patient was located at home and the provider was located office. ?Session from 230 until 3 PM ? ?Subjective:  ? ?Patient ID:  [name] [name] is a 61 y.o. (DOB [birth year]) female. ? ?Chief Complaint:  ?Chief Complaint  ?Patient presents with  ? Follow-up  ? Panic disorder with agoraphobia  ? Major depressive disorder, recurrent episode, moderate   ? ? ?Depression ?       Associated symptoms include no decreased concentration and no suicidal ideas. ?[name] [name] presents to the office today for follow-up of several psychiatric diagnoses ? ?seen January 12, 2019.[name]  No medications were changed. ? ?06/2019 appt:  ?Fired from [employer] after [employer] of the month.  Doesn't know why and doesn't understand it.  Stressed by this.  Was falsely accused of making false statements on the Covid test and yet she didn't do it. Caused her to feel more depressed.   ?Filed for unemployment.  Had an interview for [employer]  and has a history with them.  Went well.   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   ?Fine with meds.  Consistent.  Sleep problems related to the stress but getting better with some lethargy.  Low energy and motivation and reduced concentration.  Had some of it before this job loss.  Wants to try increase Wellbutrin ?No alcohol relapses but definitely more stressed and angry.  Wanting to withdraw.   ?No diet cokes.  3 cups coffee. ?Plan: Trial increase Wellbutrin Xl 450 mg for depression.   ? ?07/23/2020 appointment with the following noted: ?Too anxious at 450 mg Wellbutrin. ?Overall not well at [name] for several months.  Poor jobs, working at [employer] just to get by.  Depressed for months.  Hasn't been able to get a good job and just gave up hopeless and poor selef care for awhile.  Came out of it a little with visits from kids.  I hurt constantly in back and in her heart.  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  Falsely accused of things like not wearing PPE.   ?Plan: Abilify 2 mg potentiation.   ? ?06/05/21 appt with following noted: ?Abilify caused insomnia but helped depression and took about 3 mos and then stopped. ?[place] but getting some better.  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 ?Also got a therapist which is hlpeing too.  More hopeful for first time in a long while.  Doing positive things.  Still struggle with sleep but better than it was with problems staying asleep ?Anxiety not too bad but still struggles with depression.[name] ?Plan: Rec Trintellix trial.  She has no insurance so will check into whether she qualifies for pt assistance. ?Other option [employer]. ?Given her distance from office, getting samples is not practical. ? ?12/17/21 appt noted:  ?Really struggled with anxiety. 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  Being aroud people more helped.  Volunteer with homeless shelter and networking. ?M ill  for awhile and lost a lot of weight and pt had to go to [place]. ?More panic lately really bad  and not this bad for a long time.  Wants more control.  A couple per week and can be triggered by minor things.  Crying and sense of dread even in public, and feeling need to run away and SOB.  Thinks she needs to restart topomax bc it helped in the past. ?Initial and terminal insomnia' and only 4 hours of sleep ?Still some depression. ?No alcohol abuse. ?Plan: Continue Wellbutrin 300  ?Restart topiramate off label for panic bc helped before and increase to 50 mg BID ?Hydroxyzine 10 mg 1-2 tablets at night prn for sleep ? ?02/20/2022 appointment with the following noted: ?Tried skipping clonidine but needs it most days.  It helps a lot and so does topiramate. ?Taking hydroxyzine and sleeping much better about 6 hours instead of 3-4 hours. ?Doing pretty good and getting busier with work which helps.  Having to hustle with real estate.  Hope by end of summer will have better benefits and income.   ?Depression is manageable and better some with anxiety with the meds.  More good days than bad is progress.  Not true in January. ?SE soft drinks flat but manageable. ? ? ?Moved to [place].  Being to be outside is good for her mental health. ?Runs. ?Single care app and [employer] helped med costs. ? ?Past Psychiatric Medication Trials:  Prozac flat, Lexapro, Zoloft,   ?Wellbutrin XL 450 mg SE ?Abilify 2 insomnia ?topiramate,  ?trazodone hangover, OTC NR ?Clonidine 0.1 mg BID some fatigue ? ?Review of Systems:  ?Review of Systems  ?Musculoskeletal:  Positive for back pain. Negative for gait problem.  ?Neurological:  Negative for dizziness, tremors and speech difficulty.  ?Psychiatric/Behavioral:  Positive for dysphoric mood. Negative for agitation, behavioral problems, confusion, decreased concentration and suicidal ideas. The patient is nervous/anxious.   ?     Please refer to HPI  ? ?Medications: I have reviewed the patient's current  medications. ? ?Current Outpatient Medications  ?Medication Sig Dispense Refill  ? celecoxib (CELEBREX) 200 MG capsule Take 200 mg by mouth.    ? Multiple Vitamins-Minerals (WOMENS 50+ MULTI VITAMIN/MIN PO) Take by mouth.    ? pramipexole (MIRAPEX) 0.125 MG tablet Take 0.375 mg by mouth at bedtime. 3 tabs HS  1  ? pregabalin (LYRICA) 150 MG capsule Take 150 mg by mouth at bedtime.    ? triamterene-hydrochlorothiazide (MAXZIDE-25) 37.5-25 MG tablet TAKE 1/2 (ONE HALF) TABLET EVERY MORNING AS NEEDED FOR FLUID RETENTION    ? buPROPion (WELLBUTRIN XL) 300 MG 24 hr tablet Take 1 tablet (300 mg total) by mouth every morning. 90 tablet 1  ? cloNIDine (CATAPRES) 0.1 MG tablet Take 1 tablet (0.1 mg total) by mouth 2 (two) times daily. 60 tablet 5  ? hydrOXYzine (ATARAX) 10 MG tablet TAKE 1 TO 2 TABLETS BY MOUTH AT BEDTIME AS NEEDED 60 tablet  5  ? topiramate (TOPAMAX) 50 MG tablet Take 1 tablet (50 mg total) by mouth 2 (two) times daily. 60 tablet 5  ? ?No current facility-administered medications for this visit.  ? ? ?Medication Side Effects: None ? ?Allergies:  ?Allergies  ?Allergen Reactions  ? Sulfa Antibiotics Hives  ?  Hives & edema  ? Erythromycin Other (See Comments)  ?  Flu-like symptoms  ? ? ?Past Medical History:  ?Diagnosis Date  ? Alcohol abuse   ? Allergy   ? Anemia   ? Anorexia   ? Anxiety   ? Asthma   ? Back pain   ? Complication of anesthesia   ? Depression   ? Pancreatitis   ? PONV (postoperative nausea and vomiting)   ? Sciatica   ? ? ?Family History  ?Problem Relation Age of Onset  ? Heart disease Mother   ? Mental illness Maternal Grandmother   ? Drug abuse Maternal Grandmother   ? Cancer Maternal Grandfather   ? Cancer Paternal Grandmother   ? Cancer Paternal Grandfather   ? Drug abuse Paternal Grandfather   ? ? ?Social History  ? ?Socioeconomic History  ? Marital status: Married  ?  Spouse name: Not on file  ? Number of children: Not on file  ? Years of education: Not on file  ? Highest education level:  Not on file  ?Occupational History  ? Not on file  ?Tobacco Use  ? Smoking status: Every Day  ?  Packs/day: 0.25  ?  Years: 5.00  ?  Pack years: 1.25  ?  Types: Cigarettes  ? Smokeless tobacco: Never  ?

## 2022-08-31 ENCOUNTER — Other Ambulatory Visit: Payer: Self-pay | Admitting: Psychiatry

## 2022-08-31 DIAGNOSIS — F331 Major depressive disorder, recurrent, moderate: Secondary | ICD-10-CM

## 2022-09-01 NOTE — Telephone Encounter (Signed)
Please schedule appt

## 2022-09-15 NOTE — Telephone Encounter (Signed)
Would like a refill Wellbutrin. Send in RF to [facility] [address], [place] - [address]   Made appt 11/24/22

## 2022-11-02 ENCOUNTER — Other Ambulatory Visit: Payer: Self-pay | Admitting: Psychiatry

## 2022-11-02 DIAGNOSIS — F4001 Agoraphobia with panic disorder: Secondary | ICD-10-CM

## 2022-11-02 DIAGNOSIS — F4310 Post-traumatic stress disorder, unspecified: Secondary | ICD-10-CM

## 2022-11-02 DIAGNOSIS — F5105 Insomnia due to other mental disorder: Secondary | ICD-10-CM

## 2022-11-24 ENCOUNTER — Encounter: Payer: Self-pay | Admitting: Psychiatry

## 2022-11-24 ENCOUNTER — Ambulatory Visit (INDEPENDENT_AMBULATORY_CARE_PROVIDER_SITE_OTHER): Payer: Self-pay | Admitting: Psychiatry

## 2022-11-24 DIAGNOSIS — F331 Major depressive disorder, recurrent, moderate: Secondary | ICD-10-CM

## 2022-11-24 DIAGNOSIS — F5105 Insomnia due to other mental disorder: Secondary | ICD-10-CM

## 2022-11-24 DIAGNOSIS — Z8659 Personal history of other mental and behavioral disorders: Secondary | ICD-10-CM

## 2022-11-24 DIAGNOSIS — F4001 Agoraphobia with panic disorder: Secondary | ICD-10-CM

## 2022-11-24 DIAGNOSIS — F4310 Post-traumatic stress disorder, unspecified: Secondary | ICD-10-CM

## 2022-11-24 DIAGNOSIS — F1021 Alcohol dependence, in remission: Secondary | ICD-10-CM

## 2022-11-24 MED ORDER — TOPIRAMATE 50 MG PO TABS: 50.0000 mg | ORAL_TABLET | Freq: Two times a day (BID) | ORAL | 5 refills | Status: DC

## 2022-11-24 MED ORDER — ESZOPICLONE 2 MG PO TABS
2.0000 mg | ORAL_TABLET | Freq: Every evening | ORAL | 1 refills | Status: DC | PRN
Start: 2022-11-24 — End: 2024-08-03

## 2022-11-24 MED ORDER — BUPROPION HCL ER (XL) 300 MG PO TB24: 300.0000 mg | ORAL_TABLET | Freq: Every morning | ORAL | 1 refills | Status: DC

## 2022-11-24 NOTE — Progress Notes (Signed)
[name] [name] [id number] 08-17-1961 62 y.o.  Video Visit via My Chart  I connected with pt by video using My Chart and verified that I am speaking with the correct person using two identifiers.   I discussed the limitations, risks, security and privacy concerns of performing an evaluation and management service by My Chart  and the availability of in person appointments. I also discussed with the patient that there may be a patient responsible charge related to this service. The patient expressed understanding and agreed to proceed.  I discussed the assessment and treatment plan with the patient. The patient was provided an opportunity to ask questions and all were answered. The patient agreed with the plan and demonstrated an understanding of the instructions.   The patient was advised to call back or seek an in-person evaluation if the symptoms worsen or if the condition fails to improve as anticipated.  I provided 30 minutes of video time during this encounter.  The patient was located at home and the provider was located office. Session from [id number]  Subjective:   Patient ID:  [name] [name] is a 62 y.o. (DOB 22-Jun-1961) female.  Chief Complaint:  Chief Complaint  Patient presents with   Follow-up   Depression   Post-Traumatic Stress Disorder   Anxiety    Depression        Associated symptoms include no decreased concentration and no suicidal ideas.  [name] [name] presents to the office today for follow-up of several psychiatric diagnoses  seen January 12, 2019.[name]  No medications were changed.  06/2019 appt:  Fired from [employer] after [employer] of the month.  Doesn't know why and doesn't understand it.  Stressed by this.  Was falsely accused of making false statements on the Covid test and yet she didn't do it. Caused her to feel more depressed.   Filed for unemployment.  Had an interview for [employer] and has a history with them.  Went  well.   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   Fine with meds.  Consistent.  Sleep problems related to the stress but getting better with some lethargy.  Low energy and motivation and reduced concentration.  Had some of it before this job loss.  Wants to try increase Wellbutrin No alcohol relapses but definitely more stressed and angry.  Wanting to withdraw.   No diet cokes.  3 cups coffee. Plan: Trial increase Wellbutrin Xl 450 mg for depression.    07/23/2020 appointment with the following noted: Too anxious at 450 mg Wellbutrin. Overall not well at all for several months.  Poor jobs, working at [employer] just to get by.  Depressed for months.  Hasn't been able to get a good job and just gave up hopeless and poor selef care for awhile.  Came out of it a little with visits from kids.  I hurt constantly in back and in her heart.  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  Falsely accused of things like not wearing PPE.   Plan: Abilify 2 mg potentiation.    06/05/21 appt with following noted: Abilify caused insomnia but helped depression and took about 3 mos and then stopped. [place] but getting some better.  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 Also got a therapist which is hlpeing too.  More hopeful for first time in a long while.  Doing positive things.  Still struggle with sleep but better  than it was with problems staying asleep Anxiety not too bad but still struggles with depression.. Plan: Rec Trintellix trial.  She has no insurance so will check into whether she qualifies for pt assistance. Other option [employer]. Given her distance from office, getting samples is not practical.  12/17/21 appt noted:  Really struggled with anxiety. 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  Being aroud people more helped.  Volunteer with homeless shelter and networking. M ill for awhile and lost a lot of weight  and pt had to go to [place]. More panic lately really bad  and not this bad for a long time.  Wants more control.  A couple per week and can be triggered by minor things.  Crying and sense of dread even in public, and feeling need to run away and SOB.  Thinks she needs to restart topomax bc it helped in the past. Initial and terminal insomnia' and only 4 hours of sleep Still some depression. No alcohol abuse. Plan: Continue Wellbutrin 300  Restart topiramate off label for panic bc helped before and increase to 50 mg BID Hydroxyzine 10 mg 1-2 tablets at night prn for sleep  02/20/2022 appointment with the following noted: Tried skipping clonidine but needs it most days.  It helps a lot and so does topiramate. Taking hydroxyzine and sleeping much better about 6 hours instead of 3-4 hours. Doing pretty good and getting busier with work which helps.  Having to hustle with real estate.  Hope by end of summer will have better benefits and income.   Depression is manageable and better some with anxiety with the meds.  More good days than bad is progress.  Not true in January. SE soft drinks flat but manageable. Plan: Continue Wellbutrin 300  Continue topiramate off label for panic bc helped before and increase to 50 mg BID Hydroxyzine 10 mg 1-2 tablets at night prn for sleep  11/24/22 appt noted: Life changed in a good way.  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  Likes the coworkers.  Living in [place].  L[place] the mountains.  Is working long hours.  Saw kids at [facility] was good.  Making up for lost time last year. Still very anxious chronically but doing ok.   Not sleeping as well as sh should.  Not sure about the hydroxyzine.   Starts 430 am and works until 1030.   Having to wind down from that.  For an hour or two.  Needs more sleep.    Worries hydroxyzine.   No SI in 7-8 mos which is a record for her.  Not depressed.   Never slept 7-8 hours and ok with 6 hours.  Will have self doubt, like I'm not measuring up.    Moved to [place].  Being to be outside is good for her mental health. Runs. Single care app and [employer] helped med costs.  Past Psychiatric Medication Trials:  Prozac flat, Lexapro, Zoloft,   Wellbutrin XL 450 mg SE Abilify 2 insomnia topiramate,  trazodone hangover, OTC NR, hydroxyzine Clonidine 0.1 mg BID some fatigue Avoiding BZ bc history of alcohol  Review of Systems:  Review of Systems  Musculoskeletal:  Positive for back pain. Negative for gait problem.  Neurological:  Negative for dizziness and tremors.  Psychiatric/Behavioral:  Positive for dysphoric mood. Negative for agitation, behavioral problems, confusion, decreased concentration  and suicidal ideas. The patient is nervous/anxious.        Please refer to HPI    Medications: I have reviewed the patient's current medications.  Current Outpatient Medications  Medication Sig Dispense Refill   celecoxib (CELEBREX) 200 MG capsule Take 200 mg by mouth.     cloNIDine (CATAPRES) 0.1 MG tablet Take 1 tablet by mouth twice daily (Patient taking differently: Take 0.1 mg by mouth 2 (two) times daily. 2 tabs HS) 60 tablet 0   eszopiclone (LUNESTA) 2 MG TABS tablet Take 1 tablet (2 mg total) by mouth at bedtime as needed for sleep. Take immediately before bedtime 30 tablet 1   hydrOXYzine (ATARAX) 10 MG tablet TAKE 1 TO 2 TABLETS BY MOUTH AT BEDTIME AS NEEDED 60 tablet 0   Multiple Vitamins-Minerals (WOMENS 50+ MULTI VITAMIN/MIN PO) Take by mouth.     pramipexole (MIRAPEX) 0.125 MG tablet Take 0.375 mg by mouth at bedtime. 3 tabs HS  1   triamterene-hydrochlorothiazide (MAXZIDE-25) 37.5-25 MG tablet TAKE 1/2 (ONE HALF) TABLET EVERY MORNING AS NEEDED FOR FLUID RETENTION     buPROPion (WELLBUTRIN XL) 300 MG 24 hr tablet Take 1 tablet (300 mg total) by mouth every morning. 90 tablet 1   pregabalin (LYRICA) 150 MG capsule Take 150 mg by  mouth at bedtime. (Patient not taking: Reported on 11/24/2022)     topiramate (TOPAMAX) 50 MG tablet Take 1 tablet (50 mg total) by mouth 2 (two) times daily. 60 tablet 5   No current facility-administered medications for this visit.    Medication Side Effects: None  Allergies:  Allergies  Allergen Reactions   Sulfa Antibiotics Hives    Hives & edema   Erythromycin Other (See Comments)    Flu-like symptoms    Past Medical History:  Diagnosis Date   Alcohol abuse    Allergy    Anemia    Anorexia    Anxiety    Asthma    Back pain    Complication of anesthesia    Depression    Pancreatitis    PONV (postoperative nausea and vomiting)    Sciatica     Family History  Problem Relation Age of Onset   Heart disease Mother    Mental illness Maternal Grandmother    Drug abuse Maternal Grandmother    Cancer Maternal Grandfather    Cancer Paternal Grandmother    Cancer Paternal Grandfather    Drug abuse Paternal Grandfather     Social History   Socioeconomic History   Marital status: Married    Spouse name: Not on file   Number of children: Not on file   Years of education: Not on file   Highest education level: Not on file  Occupational History   Not on file  Tobacco Use   Smoking status: Every Day    Packs/day: 0.25    Years: 5.00    Total pack years: 1.25    Types: Cigarettes   Smokeless tobacco: Never  Substance and Sexual Activity   Alcohol use: Yes    Comment: 1.5 fifth QD, last drink 10/29/11   Drug use: No   Sexual activity: Not Currently    Birth control/protection: I.U.D.  Other Topics Concern   Not on file  Social History Narrative   Not on file   Social Determinants of Health   Financial Resource Strain: Not on file  Food Insecurity: Not on file  Transportation Needs: Not on file  Physical Activity: Not on file  Stress: Not on file  Social Connections: Not on file  Intimate Partner Violence: Not on file    Past Medical History, Surgical  history, Social history, and Family history were reviewed and updated as appropriate.   Please see review of systems for further details on the patient's review from today.   Objective:   Physical Exam:  There were no vitals taken for this visit.  Physical Exam Neurological:     Mental Status: She is alert and oriented to person, place, and time.     Cranial Nerves: No dysarthria.  Psychiatric:        Attention and Perception: Attention and perception normal.        Mood and Affect: Mood is anxious. Mood is not depressed.        Speech: Speech normal.        Behavior: Behavior is cooperative.        Thought Content: Thought content normal. Thought content is not paranoid or delusional. Thought content does not include homicidal or suicidal ideation. Thought content does not include suicidal plan.        Cognition and Memory: Cognition and memory normal.        Judgment: Judgment normal.     Comments: Insight intact     Lab Review:     Component Value Date/Time   NA 141 12/31/2014 0904   K 3.6 12/31/2014 0904   CL 110 12/31/2014 0904   CO2 23 12/31/2014 0904   GLUCOSE 107 (H) 12/31/2014 0904   BUN 10 12/31/2014 0904   CREATININE 0.80 12/31/2014 0904   CALCIUM 8.4 12/31/2014 0904   PROT 5.6 (L) 12/31/2014 0904   ALBUMIN 2.7 (L) 12/31/2014 0904   AST 27 12/31/2014 0904   ALT 21 12/31/2014 0904   ALKPHOS 61 12/31/2014 0904   BILITOT 0.1 (L) 12/31/2014 0904   GFRNONAA 83 (L) 12/31/2014 0904   GFRAA >90 12/31/2014 0904       Component Value Date/Time   WBC 6.0 12/31/2014 0904   RBC 3.57 (L) 12/31/2014 0904   HGB 11.0 (L) 12/31/2014 0904   HCT 34.4 (L) 12/31/2014 0904   PLT 317 12/31/2014 0904   MCV 96.4 12/31/2014 0904   MCH 30.8 12/31/2014 0904   MCHC 32.0 12/31/2014 0904   RDW 13.9 12/31/2014 0904   LYMPHSABS 1.3 12/31/2014 0904   MONOABS 0.4 12/31/2014 0904   EOSABS 1.0 (H) 12/31/2014 0904   BASOSABS 0.1 12/31/2014 0904    No results found for: "POCLITH",  "LITHIUM"   No results found for: "PHENYTOIN", "PHENOBARB", "VALPROATE", "CBMZ"   .res Assessment: Plan:    Panic disorder with agoraphobia - Plan: topiramate (TOPAMAX) 50 MG tablet  Major depressive disorder, recurrent episode, moderate (HCC) - Plan: buPROPion (WELLBUTRIN XL) 300 MG 24 hr tablet  PTSD (post-traumatic stress disorder) - Plan: topiramate (TOPAMAX) 50 MG tablet  History of anorexia nervosa  Alcohol dependence in remission ([place])  Insomnia due to mental condition - Plan: eszopiclone (LUNESTA) 2 MG TABS tablet   [name] [name] has a long history of multiple psychiatric problems as noted.  They intertwine and affect 1 another.  Fortunately currently she is sober.  Her anorexia makes her resistant to taking psychiatric medications that cause any risk of weight gain.  This greatly limits other antidepressant options though she could likely benefit from additional antidepressants.  Increase Wellbutrin 450 mg not tolerated.. Depresssion managed at present helped by good job and success there. Still anxious and insomnia.  Work on sleep  hygiene.  Chronically sleep deprived.  Get more sleep.  This contributes to her psychiatric instability.  She says she will work on it. Initial and terminal insomnia'.  Disc importance of winding down after long hours.  Se is aware.  Disc options with sleep meds.  Will have to try traditional one and disc risk addiction dependence but Alfonso Patten has low abuse risk generally vs Bz.  Can't afford brands.  Hates SSRI.  Offered low dosage to help with the anxiety.  Supportive therapy dealing with the job problems but she is working now..  Maintain sobriety.  Make use of AA as you have done in the past  Continue Wellbutrin 300  Continue topiramate off label for panic bc helped before and increase to 50 mg BID Lunesta 2 mg HS  Rec counseling ASAP.  She agrees  Fu 6 mos  [name] Staggers, MD, DFAPA    Please see After Visit Summary for patient specific  instructions.  No future appointments.   No orders of the defined types were placed in this encounter.      -------------------------------

## 2022-12-04 ENCOUNTER — Other Ambulatory Visit: Payer: Self-pay | Admitting: Psychiatry

## 2022-12-04 ENCOUNTER — Telehealth: Payer: Self-pay | Admitting: Psychiatry

## 2022-12-04 DIAGNOSIS — F4310 Post-traumatic stress disorder, unspecified: Secondary | ICD-10-CM

## 2022-12-04 DIAGNOSIS — F4001 Agoraphobia with panic disorder: Secondary | ICD-10-CM

## 2022-12-04 MED ORDER — CLONIDINE HCL 0.1 MG PO TABS: 0.1000 mg | ORAL_TABLET | Freq: Two times a day (BID) | ORAL | 0 refills | Status: DC

## 2022-12-04 NOTE — Telephone Encounter (Signed)
Sent!

## 2022-12-04 NOTE — Telephone Encounter (Signed)
Last appt is 11/24/22. [name] [name] is requesting a refill on Clonidine called to:  [facility], [place]   Phone: [phone]  Fax: [phone]

## 2023-01-06 ENCOUNTER — Other Ambulatory Visit: Payer: Self-pay | Admitting: Psychiatry

## 2023-01-06 DIAGNOSIS — F4310 Post-traumatic stress disorder, unspecified: Secondary | ICD-10-CM

## 2023-01-06 DIAGNOSIS — F4001 Agoraphobia with panic disorder: Secondary | ICD-10-CM

## 2023-02-01 ENCOUNTER — Other Ambulatory Visit: Payer: Self-pay | Admitting: Psychiatry

## 2023-02-01 DIAGNOSIS — F4310 Post-traumatic stress disorder, unspecified: Secondary | ICD-10-CM

## 2023-02-01 DIAGNOSIS — F4001 Agoraphobia with panic disorder: Secondary | ICD-10-CM

## 2023-05-06 ENCOUNTER — Other Ambulatory Visit: Payer: Self-pay | Admitting: Psychiatry

## 2023-05-06 DIAGNOSIS — F4001 Agoraphobia with panic disorder: Secondary | ICD-10-CM

## 2023-05-06 DIAGNOSIS — F4310 Post-traumatic stress disorder, unspecified: Secondary | ICD-10-CM

## 2023-05-06 NOTE — Telephone Encounter (Signed)
Call to RS

## 2023-06-05 ENCOUNTER — Other Ambulatory Visit: Payer: Self-pay | Admitting: Psychiatry

## 2023-06-05 DIAGNOSIS — F4001 Agoraphobia with panic disorder: Secondary | ICD-10-CM

## 2023-06-05 DIAGNOSIS — F4310 Post-traumatic stress disorder, unspecified: Secondary | ICD-10-CM

## 2023-06-29 ENCOUNTER — Other Ambulatory Visit: Payer: Self-pay | Admitting: Psychiatry

## 2023-06-29 DIAGNOSIS — F4001 Agoraphobia with panic disorder: Secondary | ICD-10-CM

## 2023-06-29 DIAGNOSIS — F4310 Post-traumatic stress disorder, unspecified: Secondary | ICD-10-CM

## 2023-06-29 NOTE — Telephone Encounter (Signed)
Please call to schedule an appt, was due last month.  

## 2023-07-03 NOTE — Telephone Encounter (Signed)
Lvm to schedule f/u

## 2023-07-28 ENCOUNTER — Other Ambulatory Visit: Payer: Self-pay | Admitting: Psychiatry

## 2023-07-28 DIAGNOSIS — F4310 Post-traumatic stress disorder, unspecified: Secondary | ICD-10-CM

## 2023-07-28 DIAGNOSIS — F4001 Agoraphobia with panic disorder: Secondary | ICD-10-CM

## 2023-07-28 NOTE — Telephone Encounter (Signed)
Rf appropriate, pt needs appt. Lv 12/14/22, rtc 6 mnth. Lf 07/01/23

## 2023-07-29 NOTE — Telephone Encounter (Signed)
Needs apt with Dr Jennelle Human

## 2023-08-28 ENCOUNTER — Other Ambulatory Visit: Payer: Self-pay | Admitting: Psychiatry

## 2023-08-28 DIAGNOSIS — F4310 Post-traumatic stress disorder, unspecified: Secondary | ICD-10-CM

## 2023-08-28 DIAGNOSIS — F4001 Agoraphobia with panic disorder: Secondary | ICD-10-CM

## 2023-08-28 NOTE — Telephone Encounter (Signed)
LF 10/14; LV 02/5 NV 12/3

## 2023-09-08 ENCOUNTER — Other Ambulatory Visit: Payer: Self-pay | Admitting: Psychiatry

## 2023-09-08 DIAGNOSIS — F331 Major depressive disorder, recurrent, moderate: Secondary | ICD-10-CM

## 2023-09-22 ENCOUNTER — Ambulatory Visit (INDEPENDENT_AMBULATORY_CARE_PROVIDER_SITE_OTHER): Payer: Self-pay | Admitting: Psychiatry

## 2023-09-22 ENCOUNTER — Encounter: Payer: Self-pay | Admitting: Psychiatry

## 2023-09-22 DIAGNOSIS — F5105 Insomnia due to other mental disorder: Secondary | ICD-10-CM

## 2023-09-22 DIAGNOSIS — F1021 Alcohol dependence, in remission: Secondary | ICD-10-CM

## 2023-09-22 DIAGNOSIS — F4310 Post-traumatic stress disorder, unspecified: Secondary | ICD-10-CM | POA: Diagnosis not present

## 2023-09-22 DIAGNOSIS — F4001 Agoraphobia with panic disorder: Secondary | ICD-10-CM

## 2023-09-22 DIAGNOSIS — F331 Major depressive disorder, recurrent, moderate: Secondary | ICD-10-CM | POA: Diagnosis not present

## 2023-09-22 DIAGNOSIS — G2581 Restless legs syndrome: Secondary | ICD-10-CM

## 2023-09-22 DIAGNOSIS — Z8659 Personal history of other mental and behavioral disorders: Secondary | ICD-10-CM

## 2023-09-22 MED ORDER — TOPIRAMATE 50 MG PO TABS: 50.0000 mg | ORAL_TABLET | Freq: Two times a day (BID) | ORAL | 5 refills | Status: DC

## 2023-09-22 MED ORDER — CLONIDINE HCL 0.1 MG PO TABS: 0.1000 mg | ORAL_TABLET | Freq: Two times a day (BID) | ORAL | 5 refills | Status: DC

## 2023-09-22 MED ORDER — BUPROPION HCL ER (XL) 300 MG PO TB24
300.0000 mg | ORAL_TABLET | Freq: Every morning | ORAL | 5 refills | Status: DC
Start: 2023-09-22 — End: 2024-05-19

## 2023-09-22 MED ORDER — HYDROXYZINE HCL 10 MG PO TABS: 10.0000 mg | ORAL_TABLET | Freq: Three times a day (TID) | ORAL | 2 refills | Status: DC | PRN
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
## 2023-10-19 ENCOUNTER — Other Ambulatory Visit: Payer: Self-pay | Admitting: Psychiatry

## 2023-10-19 DIAGNOSIS — F331 Major depressive disorder, recurrent, moderate: Secondary | ICD-10-CM

## 2023-10-19 NOTE — Telephone Encounter (Signed)
Denied rx. Lf 12/3 with 5 rf's.

## 2024-03-08 ENCOUNTER — Other Ambulatory Visit: Payer: Self-pay | Admitting: Psychiatry

## 2024-03-08 DIAGNOSIS — F4310 Post-traumatic stress disorder, unspecified: Secondary | ICD-10-CM

## 2024-03-08 DIAGNOSIS — F5105 Insomnia due to other mental disorder: Secondary | ICD-10-CM

## 2024-03-27 ENCOUNTER — Other Ambulatory Visit: Payer: Self-pay | Admitting: Psychiatry

## 2024-03-27 DIAGNOSIS — F4001 Agoraphobia with panic disorder: Secondary | ICD-10-CM

## 2024-03-27 DIAGNOSIS — F4310 Post-traumatic stress disorder, unspecified: Secondary | ICD-10-CM

## 2024-03-29 ENCOUNTER — Telehealth: Payer: Self-pay | Admitting: Psychiatry

## 2024-03-29 DIAGNOSIS — Z0289 Encounter for other administrative examinations: Secondary | ICD-10-CM

## 2024-05-19 ENCOUNTER — Other Ambulatory Visit: Payer: Self-pay | Admitting: Psychiatry

## 2024-05-19 DIAGNOSIS — F4310 Post-traumatic stress disorder, unspecified: Secondary | ICD-10-CM

## 2024-05-19 DIAGNOSIS — F4001 Agoraphobia with panic disorder: Secondary | ICD-10-CM

## 2024-05-19 DIAGNOSIS — F331 Major depressive disorder, recurrent, moderate: Secondary | ICD-10-CM

## 2024-06-20 ENCOUNTER — Other Ambulatory Visit: Payer: Self-pay | Admitting: Psychiatry

## 2024-06-20 DIAGNOSIS — F4001 Agoraphobia with panic disorder: Secondary | ICD-10-CM

## 2024-06-20 DIAGNOSIS — F4310 Post-traumatic stress disorder, unspecified: Secondary | ICD-10-CM

## 2024-06-21 ENCOUNTER — Other Ambulatory Visit: Payer: Self-pay | Admitting: Psychiatry

## 2024-06-21 DIAGNOSIS — F4310 Post-traumatic stress disorder, unspecified: Secondary | ICD-10-CM

## 2024-06-21 DIAGNOSIS — F4001 Agoraphobia with panic disorder: Secondary | ICD-10-CM

## 2024-07-27 ENCOUNTER — Other Ambulatory Visit: Payer: Self-pay | Admitting: Psychiatry

## 2024-07-27 DIAGNOSIS — F4001 Agoraphobia with panic disorder: Secondary | ICD-10-CM

## 2024-07-27 DIAGNOSIS — F4310 Post-traumatic stress disorder, unspecified: Secondary | ICD-10-CM

## 2024-08-03 ENCOUNTER — Telehealth (INDEPENDENT_AMBULATORY_CARE_PROVIDER_SITE_OTHER): Payer: Self-pay | Admitting: Psychiatry

## 2024-08-03 ENCOUNTER — Encounter: Payer: Self-pay | Admitting: Psychiatry

## 2024-08-03 DIAGNOSIS — F331 Major depressive disorder, recurrent, moderate: Secondary | ICD-10-CM

## 2024-08-03 DIAGNOSIS — F5105 Insomnia due to other mental disorder: Secondary | ICD-10-CM

## 2024-08-03 DIAGNOSIS — F4001 Agoraphobia with panic disorder: Secondary | ICD-10-CM

## 2024-08-03 DIAGNOSIS — F1021 Alcohol dependence, in remission: Secondary | ICD-10-CM

## 2024-08-03 DIAGNOSIS — F4310 Post-traumatic stress disorder, unspecified: Secondary | ICD-10-CM

## 2024-08-03 DIAGNOSIS — G2581 Restless legs syndrome: Secondary | ICD-10-CM

## 2024-08-03 DIAGNOSIS — Z8659 Personal history of other mental and behavioral disorders: Secondary | ICD-10-CM

## 2024-08-03 MED ORDER — CLONIDINE HCL 0.1 MG PO TABS: 0.2000 mg | ORAL_TABLET | Freq: Every evening | ORAL | 11 refills | Status: AC

## 2024-08-03 MED ORDER — TOPIRAMATE 50 MG PO TABS: 50.0000 mg | ORAL_TABLET | Freq: Two times a day (BID) | ORAL | 11 refills | Status: AC

## 2024-08-03 MED ORDER — BUPROPION HCL ER (XL) 300 MG PO TB24: 300.0000 mg | ORAL_TABLET | Freq: Every morning | ORAL | 11 refills | Status: AC
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
